# Patient Record
Sex: Female | Born: 1985 | Race: Black or African American | Hispanic: No | Marital: Single | Smoking: Former smoker
Health system: Southern US, Community
[De-identification: ages and names within clinical notes are randomized; demographics above are authoritative.]

## PROBLEM LIST (undated history)

## (undated) ENCOUNTER — Inpatient Hospital Stay (HOSPITAL_COMMUNITY): Payer: Self-pay

## (undated) DIAGNOSIS — A749 Chlamydial infection, unspecified: Secondary | ICD-10-CM

## (undated) DIAGNOSIS — B999 Unspecified infectious disease: Secondary | ICD-10-CM

## (undated) DIAGNOSIS — R519 Headache, unspecified: Secondary | ICD-10-CM

## (undated) DIAGNOSIS — L409 Psoriasis, unspecified: Secondary | ICD-10-CM

## (undated) DIAGNOSIS — R51 Headache: Secondary | ICD-10-CM

## (undated) DIAGNOSIS — N611 Abscess of the breast and nipple: Secondary | ICD-10-CM

## (undated) HISTORY — PX: BREAST SURGERY: SHX581

## (undated) HISTORY — DX: Psoriasis, unspecified: L40.9

---

## 2010-04-06 ENCOUNTER — Emergency Department (HOSPITAL_COMMUNITY): Admission: EM | Admit: 2010-04-06 | Discharge: 2010-04-06 | Payer: Self-pay | Admitting: Family Medicine

## 2010-04-19 ENCOUNTER — Emergency Department (HOSPITAL_COMMUNITY): Admission: EM | Admit: 2010-04-19 | Discharge: 2010-04-19 | Payer: Self-pay | Admitting: Family Medicine

## 2010-11-05 LAB — WET PREP, GENITAL
Trich, Wet Prep: NONE SEEN
Yeast Wet Prep HPF POC: NONE SEEN

## 2010-11-05 LAB — POCT PREGNANCY, URINE: Preg Test, Ur: NEGATIVE

## 2011-01-25 ENCOUNTER — Emergency Department (HOSPITAL_COMMUNITY)
Admission: EM | Admit: 2011-01-25 | Discharge: 2011-01-26 | Disposition: A | Discharge status: Home / Self Care | Payer: BC Managed Care – PPO | Attending: Emergency Medicine | Admitting: Emergency Medicine

## 2011-01-25 DIAGNOSIS — R197 Diarrhea, unspecified: Secondary | ICD-10-CM | POA: Insufficient documentation

## 2011-01-25 DIAGNOSIS — N949 Unspecified condition associated with female genital organs and menstrual cycle: Secondary | ICD-10-CM | POA: Insufficient documentation

## 2011-01-25 DIAGNOSIS — R112 Nausea with vomiting, unspecified: Secondary | ICD-10-CM | POA: Insufficient documentation

## 2011-01-25 DIAGNOSIS — N938 Other specified abnormal uterine and vaginal bleeding: Secondary | ICD-10-CM | POA: Insufficient documentation

## 2011-01-25 LAB — POCT I-STAT, CHEM 8
Calcium, Ion: 1.07 mmol/L — ABNORMAL LOW (ref 1.12–1.32)
Creatinine, Ser: 1.1 mg/dL (ref 0.4–1.2)
Glucose, Bld: 98 mg/dL (ref 70–99)
Hemoglobin: 13.3 g/dL (ref 12.0–15.0)
Potassium: 4.1 mEq/L (ref 3.5–5.1)
TCO2: 23 mmol/L (ref 0–100)

## 2011-01-25 LAB — URINALYSIS, ROUTINE W REFLEX MICROSCOPIC
Glucose, UA: NEGATIVE mg/dL
Hgb urine dipstick: NEGATIVE
Leukocytes, UA: NEGATIVE
Specific Gravity, Urine: 1.023 (ref 1.005–1.030)
pH: 7 (ref 5.0–8.0)

## 2011-01-25 LAB — WET PREP, GENITAL
Trich, Wet Prep: NONE SEEN
Yeast Wet Prep HPF POC: NONE SEEN

## 2011-01-25 LAB — CBC
Hemoglobin: 12.2 g/dL (ref 12.0–15.0)
MCH: 28 pg (ref 26.0–34.0)
MCHC: 32.8 g/dL (ref 30.0–36.0)
RDW: 12.8 % (ref 11.5–15.5)

## 2011-01-25 LAB — URINE MICROSCOPIC-ADD ON

## 2011-01-25 LAB — HCG, QUANTITATIVE, PREGNANCY: hCG, Beta Chain, Quant, S: 1 m[IU]/mL (ref <5)

## 2011-08-03 ENCOUNTER — Other Ambulatory Visit: Payer: Self-pay | Admitting: Family Medicine

## 2011-08-03 DIAGNOSIS — E010 Iodine-deficiency related diffuse (endemic) goiter: Secondary | ICD-10-CM

## 2011-08-05 ENCOUNTER — Other Ambulatory Visit: Payer: BC Managed Care – PPO

## 2011-08-08 ENCOUNTER — Ambulatory Visit
Admission: RE | Admit: 2011-08-08 | Discharge: 2011-08-08 | Disposition: A | Discharge status: Home / Self Care | Payer: BC Managed Care – PPO | Source: Ambulatory Visit | Attending: Family Medicine | Admitting: Family Medicine

## 2011-08-08 DIAGNOSIS — E010 Iodine-deficiency related diffuse (endemic) goiter: Secondary | ICD-10-CM

## 2011-12-14 ENCOUNTER — Other Ambulatory Visit: Payer: Self-pay | Admitting: Family Medicine

## 2011-12-14 ENCOUNTER — Ambulatory Visit
Admission: RE | Admit: 2011-12-14 | Discharge: 2011-12-14 | Disposition: A | Discharge status: Home / Self Care | Payer: No Typology Code available for payment source | Source: Ambulatory Visit | Attending: Family Medicine | Admitting: Family Medicine

## 2011-12-14 DIAGNOSIS — Z006 Encounter for examination for normal comparison and control in clinical research program: Secondary | ICD-10-CM

## 2013-02-13 ENCOUNTER — Encounter (HOSPITAL_COMMUNITY): Payer: Self-pay | Admitting: *Deleted

## 2013-02-13 ENCOUNTER — Other Ambulatory Visit: Payer: Self-pay

## 2013-02-13 ENCOUNTER — Emergency Department (HOSPITAL_COMMUNITY): Payer: BC Managed Care – PPO

## 2013-02-13 ENCOUNTER — Emergency Department (HOSPITAL_COMMUNITY)
Admission: EM | Admit: 2013-02-13 | Discharge: 2013-02-14 | Disposition: A | Discharge status: Home / Self Care | Payer: BC Managed Care – PPO | Attending: Emergency Medicine | Admitting: Emergency Medicine

## 2013-02-13 DIAGNOSIS — K209 Esophagitis, unspecified without bleeding: Secondary | ICD-10-CM

## 2013-02-13 DIAGNOSIS — R110 Nausea: Secondary | ICD-10-CM | POA: Insufficient documentation

## 2013-02-13 DIAGNOSIS — R0602 Shortness of breath: Secondary | ICD-10-CM | POA: Insufficient documentation

## 2013-02-13 DIAGNOSIS — R197 Diarrhea, unspecified: Secondary | ICD-10-CM | POA: Insufficient documentation

## 2013-02-13 LAB — CBC
MCH: 28.4 pg (ref 26.0–34.0)
MCHC: 33.2 g/dL (ref 30.0–36.0)
MCV: 85.4 fL (ref 78.0–100.0)
Platelets: 252 10*3/uL (ref 150–400)
RBC: 3.91 MIL/uL (ref 3.87–5.11)

## 2013-02-13 NOTE — ED Notes (Signed)
Pt states she started having chest pain yesterday. Pt states she has been lying around in the bed since Monday. Pt states this is new pt states she has been lying in bed since Monday d/t she thinks she may have stomach virus.

## 2013-02-14 LAB — BASIC METABOLIC PANEL
CO2: 26 mEq/L (ref 19–32)
Calcium: 9 mg/dL (ref 8.4–10.5)
Creatinine, Ser: 0.93 mg/dL (ref 0.50–1.10)
Glucose, Bld: 98 mg/dL (ref 70–99)

## 2013-02-14 LAB — POCT I-STAT TROPONIN I: Troponin i, poc: 0 ng/mL (ref 0.00–0.08)

## 2013-02-14 MED ORDER — SODIUM CHLORIDE 0.9 % IV SOLN
Freq: Once | INTRAVENOUS | Status: AC
  Administered 2013-02-14: 01:00:00 via INTRAVENOUS

## 2013-02-14 MED ORDER — SUCRALFATE 1 G PO TABS
1.0000 g | ORAL_TABLET | Freq: Once | ORAL | Status: AC
  Administered 2013-02-14: 1 g via ORAL
  Filled 2013-02-14: qty 1

## 2013-02-14 MED ORDER — MORPHINE SULFATE 4 MG/ML IJ SOLN
4.0000 mg | Freq: Once | INTRAMUSCULAR | Status: AC
  Administered 2013-02-14: 4 mg via INTRAVENOUS
  Filled 2013-02-14: qty 1

## 2013-02-14 MED ORDER — GI COCKTAIL ~~LOC~~
30.0000 mL | Freq: Once | ORAL | Status: AC
  Administered 2013-02-14: 30 mL via ORAL
  Filled 2013-02-14: qty 30

## 2013-02-14 MED ORDER — KETOROLAC TROMETHAMINE 30 MG/ML IJ SOLN
30.0000 mg | Freq: Once | INTRAMUSCULAR | Status: AC
  Administered 2013-02-14: 30 mg via INTRAVENOUS
  Filled 2013-02-14: qty 1

## 2013-02-14 MED ORDER — SUCRALFATE 1 G PO TABS: 1.0000 g | ORAL_TABLET | Freq: Four times a day (QID) | ORAL | Status: DC

## 2013-02-14 MED ORDER — OMEPRAZOLE 20 MG PO CPDR: 20.0000 mg | DELAYED_RELEASE_CAPSULE | Freq: Every day | ORAL | Status: DC

## 2013-02-14 MED ORDER — HYDROCODONE-ACETAMINOPHEN 5-325 MG PO TABS: 1.0000 | ORAL_TABLET | ORAL | Status: DC | PRN

## 2013-02-14 MED ORDER — POTASSIUM CHLORIDE CRYS ER 20 MEQ PO TBCR
40.0000 meq | EXTENDED_RELEASE_TABLET | Freq: Once | ORAL | Status: AC
  Administered 2013-02-14: 40 meq via ORAL
  Filled 2013-02-14: qty 2

## 2013-02-14 MED ORDER — ONDANSETRON HCL 8 MG PO TABS: 8.0000 mg | ORAL_TABLET | Freq: Three times a day (TID) | ORAL | Status: DC | PRN

## 2013-02-14 MED ORDER — FAMOTIDINE IN NACL 20-0.9 MG/50ML-% IV SOLN
20.0000 mg | Freq: Once | INTRAVENOUS | Status: AC
  Administered 2013-02-14: 20 mg via INTRAVENOUS
  Filled 2013-02-14: qty 50

## 2013-02-14 MED ORDER — PANTOPRAZOLE SODIUM 40 MG IV SOLR
40.0000 mg | Freq: Once | INTRAVENOUS | Status: AC
  Administered 2013-02-14: 40 mg via INTRAVENOUS
  Filled 2013-02-14: qty 40

## 2013-02-14 NOTE — ED Provider Notes (Signed)
History    CSN: [id number] Arrival date & time 02/13/13  2303  First MD Initiated Contact with Patient 02/13/13 2350     Chief Complaint  Patient presents with  . Chest Pain   (Consider location/radiation/quality/duration/timing/severity/associated sxs/prior Treatment) HPI History provided by pt.   Pt has had severe pain mid-line chest w/ radiation through to the back since yesterday at 11am.  Constant and aggravated by laying flat and deep inspration.  No relief w/ mylanta.  Associated w/ nausea and diarrhea as well as SOB.  Denies fever, cough, hematochezia/melena, GU sx and LE edema/pain.  No PMH and does not smoke cigarettes.  No RF for PE.  Does not drink alcohol.  Ate spaghetti the night before onset sx. History reviewed. No pertinent past medical history. History reviewed. No pertinent past surgical history. History reviewed. No pertinent family history. History  Substance Use Topics  . Smoking status: Never Smoker   . Smokeless tobacco: Never Used  . Alcohol Use: No   OB History   Grav Para Term Preterm Abortions TAB SAB Ect Mult Living                 Review of Systems  All other systems reviewed and are negative.    Allergies  Review of patient's allergies indicates no known allergies.  Home Medications   Current Outpatient Rx  Name  Route  Sig  Dispense  Refill  . calcium & magnesium carbonates (MYLANTA) 311-232 MG per tablet   Oral   Take 2 tablets by mouth 2 (two) times daily as needed for heartburn.          BP 125/81  Pulse 84  Temp(Src) 98.2 F (36.8 C) (Oral)  Resp 18  SpO2 100%  LMP 02/06/2013 Physical Exam  Nursing note and vitals reviewed. Constitutional: She is oriented to person, place, and time. She appears well-developed and well-nourished. No distress.  HENT:  Head: Normocephalic and atraumatic.  Eyes:  Normal appearance  Neck: Normal range of motion.  Cardiovascular: Normal rate and regular rhythm.   Pulmonary/Chest: Effort  normal and breath sounds normal. No respiratory distress.  Mild tenderness over sternum and LSB  Abdominal: Soft. Bowel sounds are normal. She exhibits no distension and no mass. There is no rebound and no guarding.  Obese. Epigastric ttp  Genitourinary:  No CVA tenderness  Musculoskeletal: Normal range of motion.  Neurological: She is alert and oriented to person, place, and time.  Skin: Skin is warm and dry. No rash noted.  Psychiatric: She has a normal mood and affect. Her behavior is normal.    ED Course  Procedures (including critical care time)  Date: 02/14/2013  Rate: 79  Rhythm: normal sinus rhythm  QRS Axis: normal  Intervals: normal  ST/T Wave abnormalities: normal  Conduction Disutrbances:none  Narrative Interpretation:   Old EKG Reviewed: none available   Labs Reviewed  CBC - Abnormal; Notable for the following:    Hemoglobin 11.1 (*)    HCT 33.4 (*)    All other components within normal limits  BASIC METABOLIC PANEL - Abnormal; Notable for the following:    Potassium 3.2 (*)    GFR calc non Af Amer 84 (*)    All other components within normal limits  POCT I-STAT TROPONIN I   Dg Chest Portable 1 View  02/13/2013   *RADIOLOGY REPORT*  Clinical Data: Chest pain, shortness of breath.  PORTABLE CHEST - 1 VIEW  Comparison: 12/14/2011  Findings: Heart and mediastinal  contours are within normal limits. No focal opacities or effusions.  No acute bony abnormality.  IMPRESSION: No active cardiopulmonary disease.   Original Report Authenticated By: Charlett Nose, M.D.   1. Esophagitis     MDM  27yo healthy F presents w/ CP.  No RF for or exam findings concerning for PE.  Doubt ACS based on atypical nature of pain, lack of RF and non-ischemic EKG.  CXR unremarkable.  Labs sig for mild hypokalemia only. Suspect gastritis/esophagitis; pain aggravated by laying flat, associated w/ nausea and diarrhea, ate spaghetti night before onset, and epigastrium ttp.  GI cocktail and IV  protonix/pepcid ordered.  Will reassess shortly.  12:40 AM   No improvement w/ pain.  Exam unchanged.  Pt refuses to lay back or lean forward d/t aggravation of pain.  Still have high suspicion for esophagitis.  IV morphine and po carafate ordered. 2:01 AM   No improvement in pain.  IV toradol ordered. 2:46 AM   Pain improved shortly after receiving toradol.  Etiology of pain unclear.  Will recommended low dose ibuprofen for possible costochondritis but prilosec and carafate as well.  Also prescribed zofran and hydrocodone.  Recommended avoidance of aggravating activities as well as alcohol, caffeine and acidic/spicy foods.  Return precautions discussed. 3:32 AM   Otilio Miu, PA-C 02/14/13 [phone]

## 2013-02-14 NOTE — ED Provider Notes (Signed)
Medical screening examination/treatment/procedure(s) were performed by non-physician practitioner and as supervising physician I was immediately available for consultation/collaboration.  Sunnie Nielsen, MD 02/14/13 [phone]

## 2013-10-15 ENCOUNTER — Ambulatory Visit: Payer: Self-pay | Admitting: Obstetrics

## 2013-10-28 ENCOUNTER — Ambulatory Visit (INDEPENDENT_AMBULATORY_CARE_PROVIDER_SITE_OTHER): Payer: BC Managed Care – PPO | Admitting: Obstetrics

## 2013-10-28 ENCOUNTER — Encounter: Payer: Self-pay | Admitting: Obstetrics

## 2013-10-28 VITALS — BP 114/72 | HR 81 | Temp 98.8°F | Ht 62.0 in | Wt 199.0 lb

## 2013-10-28 DIAGNOSIS — E669 Obesity, unspecified: Secondary | ICD-10-CM | POA: Insufficient documentation

## 2013-10-28 DIAGNOSIS — L309 Dermatitis, unspecified: Secondary | ICD-10-CM | POA: Insufficient documentation

## 2013-10-28 DIAGNOSIS — Z124 Encounter for screening for malignant neoplasm of cervix: Secondary | ICD-10-CM

## 2013-10-28 DIAGNOSIS — Z01419 Encounter for gynecological examination (general) (routine) without abnormal findings: Secondary | ICD-10-CM

## 2013-10-28 DIAGNOSIS — Z113 Encounter for screening for infections with a predominantly sexual mode of transmission: Secondary | ICD-10-CM

## 2013-10-28 MED ORDER — PHENTERMINE HCL 37.5 MG PO CAPS: 37.5000 mg | ORAL_CAPSULE | ORAL | Status: DC

## 2013-10-28 NOTE — Progress Notes (Signed)
Subjective:     Amanda Peters is a 28 y.o. female here for a routine exam.  Current complaints: Patient is in the office today for an Annual Exam. Patient states she has been breaking out all over. Patient states she would like to discuss her weight. Personal health questionnaire reviewed: yes.   Gynecologic History Patient's last menstrual period was 10/09/2013. Contraception: none Last Pap: 05/07/2012. Results were: normal  Obstetric History OB History  Gravida Para Term Preterm AB SAB TAB Ectopic Multiple Living  0 0 0 0 0 0 0 0 0 0          The following portions of the patient's history were reviewed and updated as appropriate: allergies, current medications, past family history, past medical history, past social history, past surgical history and problem list.  Review of Systems Pertinent items are noted in HPI.    Objective:    General appearance: alert and no distress Breasts: normal appearance, no masses or tenderness Abdomen: normal findings: soft, non-tender Pelvic: cervix normal in appearance, external genitalia normal, no adnexal masses or tenderness, no cervical motion tenderness, rectovaginal septum normal, uterus normal size, shape, and consistency and vagina normal without discharge Skin: Pruritic, hyperpigmented spots with a generalized distribution.    Assessment:    Healthy female exam.   Dermatitis   Plan:    Education reviewed: Management of skin problems.. Contraception: none. Follow up in: 1 year. Referred to Dermatologist

## 2013-10-29 ENCOUNTER — Other Ambulatory Visit: Payer: Self-pay | Admitting: Dermatology

## 2013-10-29 LAB — GC/CHLAMYDIA PROBE AMP
CT Probe RNA: NEGATIVE
GC Probe RNA: NEGATIVE

## 2013-10-29 LAB — PAP IG W/ RFLX HPV ASCU

## 2013-10-29 LAB — WET PREP BY MOLECULAR PROBE
Candida species: NEGATIVE
GARDNERELLA VAGINALIS: NEGATIVE
Trichomonas vaginosis: NEGATIVE

## 2013-11-13 ENCOUNTER — Encounter: Payer: Self-pay | Admitting: Obstetrics

## 2014-01-09 ENCOUNTER — Telehealth: Payer: Self-pay | Admitting: *Deleted

## 2014-01-09 DIAGNOSIS — B373 Candidiasis of vulva and vagina: Secondary | ICD-10-CM

## 2014-01-09 DIAGNOSIS — B3731 Acute candidiasis of vulva and vagina: Secondary | ICD-10-CM

## 2014-01-09 MED ORDER — FLUCONAZOLE 150 MG PO TABS: 150.0000 mg | ORAL_TABLET | Freq: Once | ORAL | Status: DC

## 2014-01-09 NOTE — Telephone Encounter (Signed)
Requesting refill on Diflucan.

## 2014-01-09 NOTE — Telephone Encounter (Signed)
Diflucan e.scribed .

## 2014-01-15 ENCOUNTER — Other Ambulatory Visit: Payer: Self-pay | Admitting: Obstetrics

## 2014-01-15 ENCOUNTER — Encounter: Payer: Self-pay | Admitting: Obstetrics

## 2014-01-15 ENCOUNTER — Ambulatory Visit (INDEPENDENT_AMBULATORY_CARE_PROVIDER_SITE_OTHER): Payer: BC Managed Care – PPO | Admitting: Obstetrics

## 2014-01-15 VITALS — BP 116/74 | HR 88 | Temp 99.2°F | Ht 62.0 in | Wt 186.0 lb

## 2014-01-15 DIAGNOSIS — B373 Candidiasis of vulva and vagina: Secondary | ICD-10-CM

## 2014-01-15 DIAGNOSIS — Z113 Encounter for screening for infections with a predominantly sexual mode of transmission: Secondary | ICD-10-CM

## 2014-01-15 DIAGNOSIS — B3731 Acute candidiasis of vulva and vagina: Secondary | ICD-10-CM

## 2014-01-15 MED ORDER — FLUCONAZOLE 150 MG PO TABS: ORAL_TABLET | ORAL | Status: DC

## 2014-01-15 NOTE — Addendum Note (Signed)
Addended by: Odessa Fleming on: 01/15/2014 05:18 PM   Modules accepted: Orders

## 2014-01-15 NOTE — Progress Notes (Signed)
Patient ID: Amanda Peters, female   DOB: [birth year], 29 y.o.   MRN: [id number]  Chief Complaint  Patient presents with  . Gynecologic Exam    HPI Amanda Peters is a 28 y.o. female.  Vaginal itching and curdy white discharge.  HPI  Past Medical History  Diagnosis Date  . Psoriasis     No past surgical history on file.  Family History  Problem Relation Age of Onset  . Hypertension Father   . Hypothyroidism Mother     Social History History  Substance Use Topics  . Smoking status: Former Games developer  . Smokeless tobacco: Never Used  . Alcohol Use: No    No Known Allergies  Current Outpatient Prescriptions  Medication Sig Dispense Refill  . fluconazole (DIFLUCAN) 150 MG tablet Take 1 tablet every other day.  3 tablet  2  . phentermine 37.5 MG capsule Take 1 capsule (37.5 mg total) by mouth every morning.  30 capsule  2   No current facility-administered medications for this visit.    Review of Systems Review of Systems Constitutional: negative for fatigue and weight loss Respiratory: negative for cough and wheezing Cardiovascular: negative for chest pain, fatigue and palpitations Gastrointestinal: negative for abdominal pain and change in bowel habits Genitourinary:  Vaginal discharge and itching. Integument/breast: negative for nipple discharge Musculoskeletal:negative for myalgias Neurological: negative for gait problems and tremors Behavioral/Psych: negative for abusive relationship, depression Endocrine: negative for temperature intolerance     Blood pressure 116/74, pulse 88, temperature 99.2 F (37.3 C), height 5\' 2"  (1.575 m), weight 186 lb (84.369 kg), last menstrual period 12/24/2013.  Physical Exam Physical Exam General:   alert  Skin:   no rash or abnormalities  Lungs:   clear to auscultation bilaterally  Heart:   regular rate and rhythm, S1, S2 normal, no murmur, click, rub or gallop  Breasts:   normal without suspicious masses, skin or nipple  changes or axillary nodes  Abdomen:  normal findings: no organomegaly, soft, non-tender and no hernia  Pelvis:  External genitalia: normal general appearance Urinary system: urethral meatus normal and bladder without fullness, nontender Vaginal:  Moderate amount of curdy white discharge. Cervix: normal appearance Adnexa: normal bimanual exam Uterus: anteverted and non-tender, normal size      Data Reviewed labs  Assessment    Candida vulvovaginitis.     Plan    Diflucan Rx. F/u prn  No orders of the defined types were placed in this encounter.   Meds ordered this encounter  Medications  . fluconazole (DIFLUCAN) 150 MG tablet    Sig: Take 1 tablet every other day.    Dispense:  3 tablet    Refill:  2       Brock Bad 01/15/2014, 3:16 PM

## 2014-01-16 LAB — WET PREP BY MOLECULAR PROBE
Candida species: POSITIVE — AB
GARDNERELLA VAGINALIS: POSITIVE — AB
Trichomonas vaginosis: NEGATIVE

## 2014-01-17 LAB — GC/CHLAMYDIA PROBE AMP
CT PROBE, AMP APTIMA: NEGATIVE
GC Probe RNA: NEGATIVE

## 2014-01-22 ENCOUNTER — Encounter: Payer: Self-pay | Admitting: *Deleted

## 2014-01-24 ENCOUNTER — Other Ambulatory Visit: Payer: Self-pay | Admitting: *Deleted

## 2014-01-24 ENCOUNTER — Encounter: Payer: Self-pay | Admitting: *Deleted

## 2014-01-24 DIAGNOSIS — B9689 Other specified bacterial agents as the cause of diseases classified elsewhere: Secondary | ICD-10-CM

## 2014-01-24 DIAGNOSIS — N760 Acute vaginitis: Principal | ICD-10-CM

## 2014-01-24 MED ORDER — METRONIDAZOLE 500 MG PO TABS
500.0000 mg | ORAL_TABLET | Freq: Two times a day (BID) | ORAL | Status: DC
Start: 2014-01-24 — End: 2014-11-04

## 2014-04-29 ENCOUNTER — Other Ambulatory Visit: Payer: Self-pay | Admitting: Obstetrics

## 2014-10-29 ENCOUNTER — Ambulatory Visit: Payer: BLUE CROSS/BLUE SHIELD | Admitting: Obstetrics

## 2014-11-04 ENCOUNTER — Encounter: Payer: Self-pay | Admitting: Obstetrics

## 2014-11-04 ENCOUNTER — Ambulatory Visit (INDEPENDENT_AMBULATORY_CARE_PROVIDER_SITE_OTHER): Payer: BLUE CROSS/BLUE SHIELD | Admitting: Obstetrics

## 2014-11-04 VITALS — BP 113/73 | HR 87 | Temp 99.0°F | Ht 62.25 in | Wt 194.0 lb

## 2014-11-04 DIAGNOSIS — E669 Obesity, unspecified: Secondary | ICD-10-CM

## 2014-11-04 DIAGNOSIS — Z124 Encounter for screening for malignant neoplasm of cervix: Secondary | ICD-10-CM | POA: Diagnosis not present

## 2014-11-04 DIAGNOSIS — Z01419 Encounter for gynecological examination (general) (routine) without abnormal findings: Secondary | ICD-10-CM

## 2014-11-04 DIAGNOSIS — Z30011 Encounter for initial prescription of contraceptive pills: Secondary | ICD-10-CM | POA: Diagnosis not present

## 2014-11-04 DIAGNOSIS — N946 Dysmenorrhea, unspecified: Secondary | ICD-10-CM | POA: Diagnosis not present

## 2014-11-04 MED ORDER — PHENTERMINE HCL 37.5 MG PO CAPS: 37.5000 mg | ORAL_CAPSULE | ORAL | Status: DC

## 2014-11-04 MED ORDER — IBUPROFEN 800 MG PO TABS: 800.0000 mg | ORAL_TABLET | Freq: Three times a day (TID) | ORAL | Status: DC | PRN

## 2014-11-04 MED ORDER — NORGESTIM-ETH ESTRAD TRIPHASIC 0.18/0.215/0.25 MG-35 MCG PO TABS: 1.0000 | ORAL_TABLET | Freq: Every day | ORAL | Status: DC

## 2014-11-04 NOTE — Progress Notes (Signed)
Subjective:        Amanda Peters is a 29 y.o. female here for a routine exam.  Current complaints: none.    Personal health questionnaire:  Is patient [name], have a family history of breast and/or ovarian cancer: no Is there a family history of uterine cancer diagnosed at age < 2250, gastrointestinal cancer, urinary tract cancer, family member who is a Personnel officerLynch syndrome-associated carrier: no Is the patient overweight and hypertensive, family history of diabetes, personal history of gestational diabetes, preeclampsia or PCOS: no Is patient over 4155, have PCOS,  family history of premature CHD under age 28, diabetes, smoke, have hypertension or peripheral artery disease:  no At any time, has a partner hit, kicked or otherwise hurt or frightened you?: no Over the past 2 weeks, have you felt down, depressed or hopeless?: no Over the past 2 weeks, have you felt little interest or pleasure in doing things?:no   Gynecologic History Patient's last menstrual period was 10/20/2014. Contraception: condoms Last Pap: 10-28-13. Results were: normal Last mammogram: n/a. Results were: n/a  Obstetric History OB History  Gravida Para Term Preterm AB SAB TAB Ectopic Multiple Living  0 0 0 0 0 0 0 0 0 0         Past Medical History  Diagnosis Date  . Psoriasis     History reviewed. No pertinent past surgical history.  No current outpatient prescriptions on file. No Known Allergies  History  Substance Use Topics  . Smoking status: Former Games developermoker  . Smokeless tobacco: Never Used  . Alcohol Use: No    Family History  Problem Relation Age of Onset  . Hypertension Father   . Hypothyroidism Mother       Review of Systems  Constitutional: negative for fatigue and weight loss Respiratory: negative for cough and wheezing Cardiovascular: negative for chest pain, fatigue and palpitations Gastrointestinal: negative for abdominal pain and change in bowel  habits Musculoskeletal:negative for myalgias Neurological: negative for gait problems and tremors Behavioral/Psych: negative for abusive relationship, depression Endocrine: negative for temperature intolerance   Genitourinary:negative for abnormal menstrual periods, genital lesions, hot flashes, sexual problems and vaginal discharge Integument/breast: negative for breast lump, breast tenderness, nipple discharge and skin lesion(s)    Objective:       BP 113/73 mmHg  Pulse 87  Temp(Src) 99 F (37.2 C)  Ht 5' 2.25" (1.581 m)  Wt 194 lb (87.998 kg)  BMI 35.21 kg/m2  LMP 10/20/2014 General:   alert  Skin:   no rash or abnormalities  Lungs:   clear to auscultation bilaterally  Heart:   regular rate and rhythm, S1, S2 normal, no murmur, click, rub or gallop  Breasts:   normal without suspicious masses, skin or nipple changes or axillary nodes  Abdomen:  normal findings: no organomegaly, soft, non-tender and no hernia  Pelvis:  External genitalia: normal general appearance Urinary system: urethral meatus normal and bladder without fullness, nontender Vaginal: normal without tenderness, induration or masses Cervix: normal appearance Adnexa: normal bimanual exam Uterus: anteverted and non-tender, normal size   Lab Review Urine pregnancy test Labs reviewed yes Radiologic studies reviewed no    Assessment:    Healthy female exam.    Contraception Obesity   Plan:    Education reviewed: calcium supplements, low fat, low cholesterol diet, safe sex/STD prevention, self breast exams and weight bearing exercise. Contraception: OCP (estrogen/progesterone). Follow up in: 4 months.  F/U weight management and OCP surveillance.   No orders of the  defined types were placed in this encounter.   No orders of the defined types were placed in this encounter.

## 2014-11-05 LAB — PAP IG W/ RFLX HPV ASCU

## 2014-11-06 ENCOUNTER — Encounter: Payer: Self-pay | Admitting: Obstetrics

## 2014-11-06 LAB — SURESWAB, VAGINOSIS/VAGINITIS PLUS
ATOPOBIUM VAGINAE: NOT DETECTED Log (cells/mL)
C. GLABRATA, DNA: NOT DETECTED
C. PARAPSILOSIS, DNA: NOT DETECTED
C. TROPICALIS, DNA: NOT DETECTED
C. albicans, DNA: NOT DETECTED
C. trachomatis RNA, TMA: NOT DETECTED
GARDNERELLA VAGINALIS: NOT DETECTED Log (cells/mL)
MEGASPHAERA SPECIES: NOT DETECTED Log (cells/mL)
N. gonorrhoeae RNA, TMA: NOT DETECTED
T. VAGINALIS RNA, QL TMA: NOT DETECTED

## 2014-12-31 ENCOUNTER — Telehealth: Payer: Self-pay | Admitting: *Deleted

## 2014-12-31 DIAGNOSIS — B3731 Acute candidiasis of vulva and vagina: Secondary | ICD-10-CM

## 2014-12-31 DIAGNOSIS — B373 Candidiasis of vulva and vagina: Secondary | ICD-10-CM

## 2014-12-31 MED ORDER — FLUCONAZOLE 150 MG PO TABS: 150.0000 mg | ORAL_TABLET | Freq: Every day | ORAL | Status: DC

## 2014-12-31 NOTE — Telephone Encounter (Signed)
Patient is requesting a Rx for Diflucan be forwarded to her pharmacy. 2:37 Patient c/o itching and discharge. Patient does have an appointment on 5/23 which she plans to keep. Rx sent to pharmacy.

## 2015-01-12 ENCOUNTER — Ambulatory Visit: Payer: BLUE CROSS/BLUE SHIELD | Admitting: Obstetrics

## 2015-01-14 ENCOUNTER — Encounter: Payer: Self-pay | Admitting: Obstetrics

## 2015-01-14 ENCOUNTER — Ambulatory Visit (INDEPENDENT_AMBULATORY_CARE_PROVIDER_SITE_OTHER): Payer: BLUE CROSS/BLUE SHIELD | Admitting: Certified Nurse Midwife

## 2015-01-14 VITALS — BP 119/77 | HR 91 | Temp 97.6°F | Ht 63.0 in | Wt 182.0 lb

## 2015-01-14 DIAGNOSIS — Z7251 High risk heterosexual behavior: Secondary | ICD-10-CM

## 2015-01-14 NOTE — Progress Notes (Signed)
Patient ID: Amanda Peters, female   DOB: [birth year], 29 y.o.   MRN: [id number]   Chief Complaint  Patient presents with  . Vaginitis    HPI Amanda Peters is a 29 y.o. female.  C/O change in vaginal discharge for over a week.  Has taken Diflucan as previously prescribed with improvement in symptoms, then started her period.  Has had a questionable exposure to STI and desires screening, does not want blood testing.  States that her vaginal discharge does not have an odor or itching and is thick&white with copious amounts.   HPI  Past Medical History  Diagnosis Date  . Psoriasis     History reviewed. No pertinent past surgical history.  Family History  Problem Relation Age of Onset  . Hypertension Father   . Hypothyroidism Mother     Social History History  Substance Use Topics  . Smoking status: Former Games developermoker  . Smokeless tobacco: Never Used  . Alcohol Use: 0.0 oz/week    0 Standard drinks or equivalent per week     Comment: Ocassionally    No Known Allergies  Current Outpatient Prescriptions  Medication Sig Dispense Refill  . ibuprofen (ADVIL,MOTRIN) 800 MG tablet Take 1 tablet (800 mg total) by mouth every 8 (eight) hours as needed for mild pain or moderate pain. 30 tablet 8  . Norgestimate-Ethinyl Estradiol Triphasic (ORTHO TRI-CYCLEN, 28,) 0.18/0.215/0.25 MG-35 MCG tablet Take 1 tablet by mouth daily. 1 Package 11  . phentermine 37.5 MG capsule Take 1 capsule (37.5 mg total) by mouth every morning. 30 capsule 2   No current facility-administered medications for this visit.    Review of Systems Review of Systems Constitutional: negative for fatigue and weight loss Respiratory: negative for cough and wheezing Cardiovascular: negative for chest pain, fatigue and palpitations Gastrointestinal: negative for abdominal pain and change in bowel habits Genitourinary: +vaginal discharge Integument/breast: negative for nipple discharge Musculoskeletal:negative for  myalgias Neurological: negative for gait problems and tremors Behavioral/Psych: negative for abusive relationship, depression Endocrine: negative for temperature intolerance     Blood pressure 119/77, pulse 91, temperature 97.6 F (36.4 C), height 5\' 3"  (1.6 m), weight 182 lb (82.555 kg), last menstrual period 01/08/2015.  Physical Exam Physical Exam General:   alert  Skin:   no rash or abnormalities  Lungs:   clear to auscultation bilaterally  Heart:   regular rate and rhythm, S1, S2 normal, no murmur, click, rub or gallop  Breasts:   deferred  Abdomen:  normal findings: no organomegaly, soft, non-tender and no hernia  Pelvis:  External genitalia: normal general appearance Urinary system: urethral meatus normal and bladder without fullness, nontender Vaginal: normal without tenderness, induration or masses Cervix: no CMT Adnexa: normal bimanual exam Uterus: anteverted and non-tender, normal size    50% of 15 min visit spent on counseling and coordination of care.   Data Reviewed Previous medical hx, labs, meds  Assessment     Normal vaginal flora High risk sexual activity     Plan    No orders of the defined types were placed in this encounter.   No orders of the defined types were placed in this encounter.    Follow up as needed or for annual exam in October.

## 2015-01-18 LAB — SURESWAB, VAGINOSIS/VAGINITIS PLUS
Atopobium vaginae: NOT DETECTED Log (cells/mL)
C. GLABRATA, DNA: NOT DETECTED
C. PARAPSILOSIS, DNA: NOT DETECTED
C. TRACHOMATIS RNA, TMA: NOT DETECTED
C. TROPICALIS, DNA: NOT DETECTED
C. albicans, DNA: NOT DETECTED
GARDNERELLA VAGINALIS: NOT DETECTED Log (cells/mL)
LACTOBACILLUS SPECIES: 7.8 Log (cells/mL)
MEGASPHAERA SPECIES: NOT DETECTED Log (cells/mL)
N. GONORRHOEAE RNA, TMA: NOT DETECTED
T. vaginalis RNA, QL TMA: NOT DETECTED

## 2015-03-09 ENCOUNTER — Ambulatory Visit: Payer: BLUE CROSS/BLUE SHIELD | Admitting: Obstetrics

## 2015-03-19 ENCOUNTER — Encounter: Payer: Self-pay | Admitting: Obstetrics

## 2015-03-19 ENCOUNTER — Ambulatory Visit (INDEPENDENT_AMBULATORY_CARE_PROVIDER_SITE_OTHER): Payer: BLUE CROSS/BLUE SHIELD | Admitting: Obstetrics

## 2015-03-19 VITALS — BP 117/75 | HR 98 | Temp 98.8°F | Ht 62.25 in | Wt 180.0 lb

## 2015-03-19 DIAGNOSIS — N760 Acute vaginitis: Secondary | ICD-10-CM | POA: Diagnosis not present

## 2015-03-19 DIAGNOSIS — A499 Bacterial infection, unspecified: Secondary | ICD-10-CM

## 2015-03-19 DIAGNOSIS — E669 Obesity, unspecified: Secondary | ICD-10-CM

## 2015-03-19 DIAGNOSIS — B9689 Other specified bacterial agents as the cause of diseases classified elsewhere: Secondary | ICD-10-CM

## 2015-03-19 MED ORDER — TINIDAZOLE 500 MG PO TABS: 1000.0000 mg | ORAL_TABLET | Freq: Every day | ORAL | Status: DC

## 2015-03-19 MED ORDER — FLUCONAZOLE 150 MG PO TABS: 150.0000 mg | ORAL_TABLET | Freq: Once | ORAL | Status: DC

## 2015-03-19 NOTE — Progress Notes (Signed)
Patient ID: Amanda Peters, female   DOB: March 14, 1986, 29 y.o.   MRN: [id number]  Chief Complaint  Patient presents with  . Follow-up    HPI Amanda Peters is a 29 y.o. female.  C/O malodorous vaginal discharge.  Also presents for F/U for weight loss management.  HPI  Past Medical History  Diagnosis Date  . Psoriasis     History reviewed. No pertinent past surgical history.  Family History  Problem Relation Age of Onset  . Hypertension Father   . Hypothyroidism Mother     Social History History  Substance Use Topics  . Smoking status: Former Games developer  . Smokeless tobacco: Never Used  . Alcohol Use: 0.0 oz/week    0 Standard drinks or equivalent per week     Comment: Ocassionally    No Known Allergies  Current Outpatient Prescriptions  Medication Sig Dispense Refill  . ibuprofen (ADVIL,MOTRIN) 800 MG tablet Take 1 tablet (800 mg total) by mouth every 8 (eight) hours as needed for mild pain or moderate pain. 30 tablet 8  . Norgestimate-Ethinyl Estradiol Triphasic (ORTHO TRI-CYCLEN, 28,) 0.18/0.215/0.25 MG-35 MCG tablet Take 1 tablet by mouth daily. 1 Package 11  . phentermine 37.5 MG capsule Take 1 capsule (37.5 mg total) by mouth every morning. 30 capsule 2  . fluconazole (DIFLUCAN) 150 MG tablet Take 1 tablet (150 mg total) by mouth once. 1 tablet 2  . tinidazole (TINDAMAX) 500 MG tablet Take 2 tablets (1,000 mg total) by mouth daily with breakfast. 10 tablet 2   No current facility-administered medications for this visit.    Review of Systems Review of Systems Constitutional: positive for weight loss Respiratory: negative for cough and wheezing Cardiovascular: negative for chest pain, fatigue and palpitations Gastrointestinal: negative for abdominal pain and change in bowel habits Genitourinary: malodorous vaginal discharge Integument/breast: negative for nipple discharge Musculoskeletal:negative for myalgias Neurological: negative for gait problems and  tremors Behavioral/Psych: negative for abusive relationship, depression Endocrine: negative for temperature intolerance     Blood pressure 117/75, pulse 98, temperature 98.8 F (37.1 C), height 5' 2.25" (1.581 m), weight 180 lb (81.647 kg).  Physical Exam Physical Exam General:   alert  Skin:   no rash or abnormalities  Lungs:   clear to auscultation bilaterally  Heart:   regular rate and rhythm, S1, S2 normal, no murmur, click, rub or gallop  Breasts:   normal without suspicious masses, skin or nipple changes or axillary nodes  Abdomen:  normal findings: no organomegaly, soft, non-tender and no hernia  Pelvis:  External genitalia: normal general appearance Urinary system: urethral meatus normal and bladder without fullness, nontender Vaginal: normal without tenderness, induration or masses Cervix: normal appearance Adnexa: normal bimanual exam Uterus: anteverted and non-tender, normal size    Data Reviewed Weight Labs  Assessment     BV Obesity.  Good response to medical management     Plan    Tindamax Rx  Encouraged behavior modification with sensible low caloric diet and exercise. F/U prn  Orders Placed This Encounter  Procedures  . SureSwab, Vaginosis/Vaginitis Plus   Meds ordered this encounter  Medications  . tinidazole (TINDAMAX) 500 MG tablet    Sig: Take 2 tablets (1,000 mg total) by mouth daily with breakfast.    Dispense:  10 tablet    Refill:  2  . fluconazole (DIFLUCAN) 150 MG tablet    Sig: Take 1 tablet (150 mg total) by mouth once.    Dispense:  1 tablet    Refill:  2             

## 2015-03-23 ENCOUNTER — Other Ambulatory Visit: Payer: Self-pay | Admitting: Obstetrics

## 2015-03-23 LAB — SURESWAB, VAGINOSIS/VAGINITIS PLUS
Atopobium vaginae: >8 Log (cells/mL)
C. PARAPSILOSIS, DNA: NOT DETECTED
C. albicans, DNA: NOT DETECTED
C. glabrata, DNA: NOT DETECTED
C. trachomatis RNA, TMA: NOT DETECTED
C. tropicalis, DNA: NOT DETECTED
Gardnerella vaginalis: >8 Log (cells/mL)
LACTOBACILLUS SPECIES: NOT DETECTED Log (cells/mL)
MEGASPHAERA SPECIES: >8 Log (cells/mL)
N. GONORRHOEAE RNA, TMA: NOT DETECTED
T. vaginalis RNA, QL TMA: NOT DETECTED

## 2015-10-06 ENCOUNTER — Ambulatory Visit (INDEPENDENT_AMBULATORY_CARE_PROVIDER_SITE_OTHER): Payer: BLUE CROSS/BLUE SHIELD | Admitting: Obstetrics

## 2015-10-06 ENCOUNTER — Encounter: Payer: Self-pay | Admitting: Obstetrics

## 2015-10-06 VITALS — BP 113/76 | HR 77 | Ht 62.0 in | Wt 201.0 lb

## 2015-10-06 DIAGNOSIS — Z3009 Encounter for other general counseling and advice on contraception: Secondary | ICD-10-CM | POA: Diagnosis not present

## 2015-10-06 DIAGNOSIS — E669 Obesity, unspecified: Secondary | ICD-10-CM

## 2015-10-06 DIAGNOSIS — N946 Dysmenorrhea, unspecified: Secondary | ICD-10-CM | POA: Diagnosis not present

## 2015-10-06 MED ORDER — IBUPROFEN 800 MG PO TABS: 800.0000 mg | ORAL_TABLET | Freq: Three times a day (TID) | ORAL | Status: DC | PRN

## 2015-10-06 MED ORDER — PHENTERMINE HCL 37.5 MG PO CAPS: 37.5000 mg | ORAL_CAPSULE | ORAL | Status: DC

## 2015-10-06 NOTE — Progress Notes (Signed)
Patient ID: Amanda Peters, female   DOB: 12-30-1985, 30 y.o.   MRN: [id number]  Chief Complaint  Patient presents with  . Advice Only    phentermine, would like to start.  had taken in 2016. discuss depo    HPI Amanda Peters is a 30 y.o. female.   Obese.  Requests medical management of obesity.  Wants to continue contraception but not sure which is better with obesity.  Periods are painful but Ibuprofen works well. HPI  Past Medical History  Diagnosis Date  . Psoriasis     History reviewed. No pertinent past surgical history.  Family History  Problem Relation Age of Onset  . Hypertension Father   . Hypothyroidism Mother     Social History Social History  Substance Use Topics  . Smoking status: Former Games developer  . Smokeless tobacco: Never Used  . Alcohol Use: 0.0 oz/week    0 Standard drinks or equivalent per week     Comment: Ocassionally    No Known Allergies  Current Outpatient Prescriptions  Medication Sig Dispense Refill  . ibuprofen (ADVIL,MOTRIN) 800 MG tablet Take 1 tablet (800 mg total) by mouth every 8 (eight) hours as needed for mild pain or moderate pain. 30 tablet 8  . Norgestimate-Ethinyl Estradiol Triphasic (ORTHO TRI-CYCLEN, 28,) 0.18/0.215/0.25 MG-35 MCG tablet Take 1 tablet by mouth daily. (Patient not taking: Reported on 10/06/2015) 1 Package 11  . phentermine 37.5 MG capsule Take 1 capsule (37.5 mg total) by mouth every morning. 30 capsule 2   No current facility-administered medications for this visit.    Review of Systems Review of Systems Constitutional: negative for fatigue and weight loss.  Positive for obesity Respiratory: negative for cough and wheezing Cardiovascular: negative for chest pain, fatigue and palpitations Gastrointestinal: negative for abdominal pain and change in bowel habits Genitourinary: positive for painful periods Integument/breast: negative for nipple discharge Musculoskeletal:negative for myalgias Neurological:  negative for gait problems and tremors Behavioral/Psych: negative for abusive relationship, depression Endocrine: negative for temperature intolerance     Blood pressure 113/76, pulse 77, height  (1.575 m), weight 201 lb (91.173 kg), last menstrual period 09/23/2015.  Physical Exam Physical Exam: Deferred  100% of 15 min visit spent on counseling and coordination of care.   Data Reviewed Labs  Assessment     Obesity Contraceptive counseling and advice Dysmenorrhea      Plan:  Phentermine Rx with counseling on weight management with diet, exercise and medication all being important parts of successful weight management. Continue OCP's.  Depo Provera not a good choice with obesity. Ibuprofen Rx with counseling on management of dysmenorrhea. F/U in March for annual exam and pap.  No orders of the defined types were placed in this encounter.   Meds ordered this encounter  Medications  . phentermine 37.5 MG capsule    Sig: Take 1 capsule (37.5 mg total) by mouth every morning.    Dispense:  30 capsule    Refill:  2  . ibuprofen (ADVIL,MOTRIN) 800 MG tablet    Sig: Take 1 tablet (800 mg total) by mouth every 8 (eight) hours as needed for mild pain or moderate pain.    Dispense:  30 tablet    Refill:  8

## 2015-11-05 ENCOUNTER — Encounter: Payer: Self-pay | Admitting: Obstetrics

## 2015-11-05 ENCOUNTER — Ambulatory Visit (INDEPENDENT_AMBULATORY_CARE_PROVIDER_SITE_OTHER): Payer: BLUE CROSS/BLUE SHIELD | Admitting: Obstetrics

## 2015-11-05 VITALS — BP 114/74 | HR 80 | Temp 98.4°F | Wt 194.0 lb

## 2015-11-05 DIAGNOSIS — E669 Obesity, unspecified: Secondary | ICD-10-CM

## 2015-11-05 DIAGNOSIS — Z01419 Encounter for gynecological examination (general) (routine) without abnormal findings: Secondary | ICD-10-CM

## 2015-11-05 DIAGNOSIS — Z3009 Encounter for other general counseling and advice on contraception: Secondary | ICD-10-CM

## 2015-11-05 DIAGNOSIS — Z0000 Encounter for general adult medical examination without abnormal findings: Secondary | ICD-10-CM

## 2015-11-05 NOTE — Progress Notes (Signed)
Subjective:        Amanda Peters is a 30 y.o. female here for a routine exam.  Current complaints: Irregular periods.  On OCP's but taking properly.  Wants a different contraceptive because she cannot remember.   Personal health questionnaire:  Is patient Amanda Peters, have a family history of breast and/or ovarian cancer: no Is there a family history of uterine cancer diagnosed at age < 12, gastrointestinal cancer, urinary tract cancer, family member who is a Personnel officer syndrome-associated carrier: no Is the patient overweight and hypertensive, family history of diabetes, personal history of gestational diabetes, preeclampsia or PCOS: no Is patient over 53, have PCOS,  family history of premature CHD under age 13, diabetes, smoke, have hypertension or peripheral artery disease:  no At any time, has a partner hit, kicked or otherwise hurt or frightened you?: no Over the past 2 weeks, have you felt down, depressed or hopeless?: no Over the past 2 weeks, have you felt little interest or pleasure in doing things?:no   Gynecologic History Patient's last menstrual period was 09/25/2015. Contraception: OCP (estrogen/progesterone) Last Pap: 2016. Results were: normal Last mammogram: n/a. Results were: n/a  Obstetric History OB History  Gravida Para Term Preterm AB SAB TAB Ectopic Multiple Living         Past Medical History  Diagnosis Date  . Psoriasis     History reviewed. No pertinent past surgical history.   Current outpatient prescriptions:  .  phentermine 37.5 MG capsule, Take 1 capsule (37.5 mg total) by mouth every morning., Disp: 30 capsule, Rfl: 2 .  ibuprofen (ADVIL,MOTRIN) 800 MG tablet, Take 1 tablet (800 mg total) by mouth every 8 (eight) hours as needed for mild pain or moderate pain. (Patient not taking: Reported on 11/05/2015), Disp: 30 tablet, Rfl: 8 No Known Allergies  Social History  Substance Use Topics  . Smoking status: Former Games developer   . Smokeless tobacco: Never Used  . Alcohol Use: 0.0 oz/week    0 Standard drinks or equivalent per week     Comment: Ocassionally    Family History  Problem Relation Age of Onset  . Hypertension Father   . Hypothyroidism Mother       Review of Systems  Constitutional: negative for fatigue and weight loss Respiratory: negative for cough and wheezing Cardiovascular: negative for chest pain, fatigue and palpitations Gastrointestinal: negative for abdominal pain and change in bowel habits Musculoskeletal:negative for myalgias Neurological: negative for gait problems and tremors Behavioral/Psych: negative for abusive relationship, depression Endocrine: negative for temperature intolerance   Genitourinary: positive for abnormal menstrual periods, and negative for genital lesions, hot flashes, sexual problems and vaginal discharge Integument/breast: negative for breast lump, breast tenderness, nipple discharge and skin lesion(s)    Objective:       BP 114/74 mmHg  Pulse 80  Temp(Src) 98.4 F (36.9 C)  Wt 194 lb (87.998 kg)  LMP 09/25/2015 General:   alert  Skin:   no rash or abnormalities  Lungs:   clear to auscultation bilaterally  Heart:   regular rate and rhythm, S1, S2 normal, no murmur, click, rub or gallop  Breasts:   normal without suspicious masses, skin or nipple changes or axillary nodes  Abdomen:  normal findings: no organomegaly, soft, non-tender and no hernia  Pelvis:  External genitalia: normal general appearance Urinary system: urethral meatus normal and bladder without fullness, nontender Vaginal: normal without tenderness, induration or masses Cervix: normal  appearance Adnexa: normal bimanual exam Uterus: anteverted and non-tender, normal size   Lab Review Urine pregnancy test Labs reviewed yes Radiologic studies reviewed no  Assessment:  Healthy female  Contraceptive counseling and advice.  Poor compliance with OCP's.   Plan:   Educational  material on contraceptive choices dispensed  Education reviewed: low fat, low cholesterol diet, safe sex/STD prevention, self breast exams and weight bearing exercise. Contraception: Considering options. Follow up in: 1 year.   No orders of the defined types were placed in this encounter.   Orders Placed This Encounter  Procedures  . NuSwab Vaginitis Plus (VG+)  . Ambulatory referral to Internal Medicine    Referral Priority:  Routine    Referral Type:  Consultation    Referral Reason:  Specialty Services Required    Requested Specialty:  Internal Medicine    Number of Visits Requested:  1

## 2015-11-05 NOTE — Patient Instructions (Signed)
Contraception Choices Contraception (birth control) is the use of any methods or devices to prevent pregnancy. Below are some methods to help avoid pregnancy. HORMONAL METHODS   Contraceptive implant. This is a thin, plastic tube containing progesterone hormone. It does not contain estrogen hormone. Your health care provider inserts the tube in the inner part of the upper arm. The tube can remain in place for up to 3 years. After 3 years, the implant must be removed. The implant prevents the ovaries from releasing an egg (ovulation), thickens the cervical mucus to prevent sperm from entering the uterus, and thins the lining of the inside of the uterus.  Progesterone-only injections. These injections are given every 3 months by your health care provider to prevent pregnancy. This synthetic progesterone hormone stops the ovaries from releasing eggs. It also thickens cervical mucus and changes the uterine lining. This makes it harder for sperm to survive in the uterus.  Birth control pills. These pills contain estrogen and progesterone hormone. They work by preventing the ovaries from releasing eggs (ovulation). They also cause the cervical mucus to thicken, preventing the sperm from entering the uterus. Birth control pills are prescribed by a health care provider.Birth control pills can also be used to treat heavy periods.  Minipill. This type of birth control pill contains only the progesterone hormone. They are taken every day of each month and must be prescribed by your health care provider.  Birth control patch. The patch contains hormones similar to those in birth control pills. It must be changed once a week and is prescribed by a health care provider.  Vaginal ring. The ring contains hormones similar to those in birth control pills. It is left in the vagina for 3 weeks, removed for 1 week, and then a new one is put back in place. The patient must be comfortable inserting and removing the ring  from the vagina.A health care provider's prescription is necessary.  Emergency contraception. Emergency contraceptives prevent pregnancy after unprotected sexual intercourse. This pill can be taken right after sex or up to 5 days after unprotected sex. It is most effective the sooner you take the pills after having sexual intercourse. Most emergency contraceptive pills are available without a prescription. Check with your pharmacist. Do not use emergency contraception as your only form of birth control. BARRIER METHODS   Female condom. This is a thin sheath (latex or rubber) that is worn over the penis during sexual intercourse. It can be used with spermicide to increase effectiveness.  Female condom. This is a soft, loose-fitting sheath that is put into the vagina before sexual intercourse.  Diaphragm. This is a soft, latex, dome-shaped barrier that must be fitted by a health care provider. It is inserted into the vagina, along with a spermicidal jelly. It is inserted before intercourse. The diaphragm should be left in the vagina for 6 to 8 hours after intercourse.  Cervical cap. This is a round, soft, latex or plastic cup that fits over the cervix and must be fitted by a health care provider. The cap can be left in place for up to 48 hours after intercourse.  Sponge. This is a soft, circular piece of polyurethane foam. The sponge has spermicide in it. It is inserted into the vagina after wetting it and before sexual intercourse.  Spermicides. These are chemicals that kill or block sperm from entering the cervix and uterus. They come in the form of creams, jellies, suppositories, foam, or tablets. They do not require a   prescription. They are inserted into the vagina with an applicator before having sexual intercourse. The process must be repeated every time you have sexual intercourse. INTRAUTERINE CONTRACEPTION  Intrauterine device (IUD). This is a T-shaped device that is put in a woman's uterus  during a menstrual period to prevent pregnancy. There are 2 types:  Copper IUD. This type of IUD is wrapped in copper wire and is placed inside the uterus. Copper makes the uterus and fallopian tubes produce a fluid that kills sperm. It can stay in place for 10 years.  Hormone IUD. This type of IUD contains the hormone progestin (synthetic progesterone). The hormone thickens the cervical mucus and prevents sperm from entering the uterus, and it also thins the uterine lining to prevent implantation of a fertilized egg. The hormone can weaken or kill the sperm that get into the uterus. It can stay in place for 3-5 years, depending on which type of IUD is used. PERMANENT METHODS OF CONTRACEPTION  Female tubal ligation. This is when the woman's fallopian tubes are surgically sealed, tied, or blocked to prevent the egg from traveling to the uterus.  Hysteroscopic sterilization. This involves placing a small coil or insert into each fallopian tube. Your doctor uses a technique called hysteroscopy to do the procedure. The device causes scar tissue to form. This results in permanent blockage of the fallopian tubes, so the sperm cannot fertilize the egg. It takes about 3 months after the procedure for the tubes to become blocked. You must use another form of birth control for these 3 months.  Female sterilization. This is when the female has the tubes that carry sperm tied off (vasectomy).This blocks sperm from entering the vagina during sexual intercourse. After the procedure, the man can still ejaculate fluid (semen). NATURAL PLANNING METHODS  Natural family planning. This is not having sexual intercourse or using a barrier method (condom, diaphragm, cervical cap) on days the woman could become pregnant.  Calendar method. This is keeping track of the length of each menstrual cycle and identifying when you are fertile.  Ovulation method. This is avoiding sexual intercourse during ovulation.  Symptothermal  method. This is avoiding sexual intercourse during ovulation, using a thermometer and ovulation symptoms.  Post-ovulation method. This is timing sexual intercourse after you have ovulated. Regardless of which type or method of contraception you choose, it is important that you use condoms to protect against the transmission of sexually transmitted infections (STIs). Talk with your health care provider about which form of contraception is most appropriate for you.   This information is not intended to replace advice given to you by your health care provider. Make sure you discuss any questions you have with your health care provider.   Document Released: 08/08/2005 Document Revised: 08/13/2013 Document Reviewed: 01/31/2013 Elsevier Interactive Patient Education [employer] Contraception Choices Contraception (birth control) is the use of any methods or devices to prevent pregnancy. Below are some methods to help avoid pregnancy. HORMONAL METHODS   Contraceptive implant. This is a thin, plastic tube containing progesterone hormone. It does not contain estrogen hormone. Your health care provider inserts the tube in the inner part of the upper arm. The tube can remain in place for up to 3 years. After 3 years, the implant must be removed. The implant prevents the ovaries from releasing an egg (ovulation), thickens the cervical mucus to prevent sperm from entering the uterus, and thins the lining of the inside of the uterus.  Progesterone-only injections. These injections are  given every 3 months by your health care provider to prevent pregnancy. This synthetic progesterone hormone stops the ovaries from releasing eggs. It also thickens cervical mucus and changes the uterine lining. This makes it harder for sperm to survive in the uterus.  Birth control pills. These pills contain estrogen and progesterone hormone. They work by preventing the ovaries from releasing eggs (ovulation). They also  cause the cervical mucus to thicken, preventing the sperm from entering the uterus. Birth control pills are prescribed by a health care provider.Birth control pills can also be used to treat heavy periods.  Minipill. This type of birth control pill contains only the progesterone hormone. They are taken every day of each month and must be prescribed by your health care provider.  Birth control patch. The patch contains hormones similar to those in birth control pills. It must be changed once a week and is prescribed by a health care provider.  Vaginal ring. The ring contains hormones similar to those in birth control pills. It is left in the vagina for 3 weeks, removed for 1 week, and then a new one is put back in place. The patient must be comfortable inserting and removing the ring from the vagina.A health care provider's prescription is necessary.  Emergency contraception. Emergency contraceptives prevent pregnancy after unprotected sexual intercourse. This pill can be taken right after sex or up to 5 days after unprotected sex. It is most effective the sooner you take the pills after having sexual intercourse. Most emergency contraceptive pills are available without a prescription. Check with your pharmacist. Do not use emergency contraception as your only form of birth control. BARRIER METHODS   Female condom. This is a thin sheath (latex or rubber) that is worn over the penis during sexual intercourse. It can be used with spermicide to increase effectiveness.  Female condom. This is a soft, loose-fitting sheath that is put into the vagina before sexual intercourse.  Diaphragm. This is a soft, latex, dome-shaped barrier that must be fitted by a health care provider. It is inserted into the vagina, along with a spermicidal jelly. It is inserted before intercourse. The diaphragm should be left in the vagina for 6 to 8 hours after intercourse.  Cervical cap. This is a round, soft, latex or plastic  cup that fits over the cervix and must be fitted by a health care provider. The cap can be left in place for up to 48 hours after intercourse.  Sponge. This is a soft, circular piece of polyurethane foam. The sponge has spermicide in it. It is inserted into the vagina after wetting it and before sexual intercourse.  Spermicides. These are chemicals that kill or block sperm from entering the cervix and uterus. They come in the form of creams, jellies, suppositories, foam, or tablets. They do not require a prescription. They are inserted into the vagina with an applicator before having sexual intercourse. The process must be repeated every time you have sexual intercourse. INTRAUTERINE CONTRACEPTION  Intrauterine device (IUD). This is a T-shaped device that is put in a woman's uterus during a menstrual period to prevent pregnancy. There are 2 types:  Copper IUD. This type of IUD is wrapped in copper wire and is placed inside the uterus. Copper makes the uterus and fallopian tubes produce a fluid that kills sperm. It can stay in place for 10 years.  Hormone IUD. This type of IUD contains the hormone progestin (synthetic progesterone). The hormone thickens the cervical mucus and prevents sperm  from entering the uterus, and it also thins the uterine lining to prevent implantation of a fertilized egg. The hormone can weaken or kill the sperm that get into the uterus. It can stay in place for 3-5 years, depending on which type of IUD is used. PERMANENT METHODS OF CONTRACEPTION  Female tubal ligation. This is when the woman's fallopian tubes are surgically sealed, tied, or blocked to prevent the egg from traveling to the uterus.  Hysteroscopic sterilization. This involves placing a small coil or insert into each fallopian tube. Your doctor uses a technique called hysteroscopy to do the procedure. The device causes scar tissue to form. This results in permanent blockage of the fallopian tubes, so the sperm  cannot fertilize the egg. It takes about 3 months after the procedure for the tubes to become blocked. You must use another form of birth control for these 3 months.  Female sterilization. This is when the female has the tubes that carry sperm tied off (vasectomy).This blocks sperm from entering the vagina during sexual intercourse. After the procedure, the man can still ejaculate fluid (semen). NATURAL PLANNING METHODS  Natural family planning. This is not having sexual intercourse or using a barrier method (condom, diaphragm, cervical cap) on days the woman could become pregnant.  Calendar method. This is keeping track of the length of each menstrual cycle and identifying when you are fertile.  Ovulation method. This is avoiding sexual intercourse during ovulation.  Symptothermal method. This is avoiding sexual intercourse during ovulation, using a thermometer and ovulation symptoms.  Post-ovulation method. This is timing sexual intercourse after you have ovulated. Regardless of which type or method of contraception you choose, it is important that you use condoms to protect against the transmission of sexually transmitted infections (STIs). Talk with your health care provider about which form of contraception is most appropriate for you.   This information is not intended to replace advice given to you by your health care provider. Make sure you discuss any questions you have with your health care provider.   Document Released: 08/08/2005 Document Revised: 08/13/2013 Document Reviewed: 01/31/2013 Elsevier Interactive Patient Education [employer] Inc[employer]

## 2015-11-07 ENCOUNTER — Other Ambulatory Visit: Payer: Self-pay | Admitting: Obstetrics

## 2015-11-07 DIAGNOSIS — B3731 Acute candidiasis of vulva and vagina: Secondary | ICD-10-CM

## 2015-11-07 DIAGNOSIS — B373 Candidiasis of vulva and vagina: Secondary | ICD-10-CM

## 2015-11-07 LAB — NUSWAB VAGINITIS PLUS (VG+)
Candida albicans, NAA: POSITIVE — AB
Candida glabrata, NAA: NEGATIVE
Chlamydia trachomatis, NAA: NEGATIVE
NEISSERIA GONORRHOEAE, NAA: NEGATIVE
Trich vag by NAA: NEGATIVE

## 2015-11-07 MED ORDER — FLUCONAZOLE 150 MG PO TABS: 150.0000 mg | ORAL_TABLET | Freq: Once | ORAL | Status: DC

## 2015-11-09 LAB — PAP IG W/ RFLX HPV ASCU: PAP Smear Comment: 0

## 2015-12-01 DIAGNOSIS — R21 Rash and other nonspecific skin eruption: Secondary | ICD-10-CM | POA: Diagnosis not present

## 2015-12-01 DIAGNOSIS — J069 Acute upper respiratory infection, unspecified: Secondary | ICD-10-CM | POA: Diagnosis not present

## 2015-12-28 DIAGNOSIS — L409 Psoriasis, unspecified: Secondary | ICD-10-CM | POA: Diagnosis not present

## 2016-01-04 DIAGNOSIS — D485 Neoplasm of uncertain behavior of skin: Secondary | ICD-10-CM | POA: Diagnosis not present

## 2016-01-04 DIAGNOSIS — L418 Other parapsoriasis: Secondary | ICD-10-CM | POA: Diagnosis not present

## 2016-01-21 ENCOUNTER — Ambulatory Visit (INDEPENDENT_AMBULATORY_CARE_PROVIDER_SITE_OTHER): Payer: BLUE CROSS/BLUE SHIELD | Admitting: Internal Medicine

## 2016-01-21 ENCOUNTER — Encounter: Payer: Self-pay | Admitting: Internal Medicine

## 2016-01-21 ENCOUNTER — Other Ambulatory Visit (INDEPENDENT_AMBULATORY_CARE_PROVIDER_SITE_OTHER): Payer: BLUE CROSS/BLUE SHIELD

## 2016-01-21 VITALS — BP 112/78 | HR 77 | Temp 98.2°F | Resp 16 | Ht 63.0 in | Wt 198.0 lb

## 2016-01-21 DIAGNOSIS — Z139 Encounter for screening, unspecified: Secondary | ICD-10-CM

## 2016-01-21 DIAGNOSIS — G43009 Migraine without aura, not intractable, without status migrainosus: Secondary | ICD-10-CM | POA: Diagnosis not present

## 2016-01-21 DIAGNOSIS — G43909 Migraine, unspecified, not intractable, without status migrainosus: Secondary | ICD-10-CM | POA: Insufficient documentation

## 2016-01-21 DIAGNOSIS — Z23 Encounter for immunization: Secondary | ICD-10-CM

## 2016-01-21 DIAGNOSIS — E669 Obesity, unspecified: Secondary | ICD-10-CM

## 2016-01-21 LAB — COMPREHENSIVE METABOLIC PANEL
ALBUMIN: 4.6 g/dL (ref 3.5–5.2)
ALK PHOS: 50 U/L (ref 39–117)
ALT: 19 U/L (ref 0–35)
AST: 22 U/L (ref 0–37)
BILIRUBIN TOTAL: 0.3 mg/dL (ref 0.2–1.2)
BUN: 12 mg/dL (ref 6–23)
CALCIUM: 9.5 mg/dL (ref 8.4–10.5)
CO2: 29 meq/L (ref 19–32)
CREATININE: 0.98 mg/dL (ref 0.40–1.20)
Chloride: 103 mEq/L (ref 96–112)
GFR: 86.17 mL/min (ref >60.00)
Glucose, Bld: 95 mg/dL (ref 70–99)
Potassium: 4.4 mEq/L (ref 3.5–5.1)
Sodium: 137 mEq/L (ref 135–145)
Total Protein: 7.7 g/dL (ref 6.0–8.3)

## 2016-01-21 LAB — CBC WITH DIFFERENTIAL/PLATELET
BASOS ABS: 0 10*3/uL (ref 0.0–0.1)
BASOS PCT: 0.6 % (ref 0.0–3.0)
Eosinophils Absolute: 0.1 10*3/uL (ref 0.0–0.7)
Eosinophils Relative: 2.5 % (ref 0.0–5.0)
HEMATOCRIT: 39.2 % (ref 36.0–46.0)
HEMOGLOBIN: 13 g/dL (ref 12.0–15.0)
LYMPHS PCT: 45.8 % (ref 12.0–46.0)
Lymphs Abs: 1.8 10*3/uL (ref 0.7–4.0)
MCHC: 33.2 g/dL (ref 30.0–36.0)
MCV: 86.3 fl (ref 78.0–100.0)
MONOS PCT: 7.1 % (ref 3.0–12.0)
Monocytes Absolute: 0.3 10*3/uL (ref 0.1–1.0)
NEUTROS ABS: 1.7 10*3/uL (ref 1.4–7.7)
Neutrophils Relative %: 44 % (ref 43.0–77.0)
PLATELETS: 314 10*3/uL (ref 150.0–400.0)
RBC: 4.54 Mil/uL (ref 3.87–5.11)
RDW: 14.4 % (ref 11.5–15.5)
WBC: 3.9 10*3/uL — AB (ref 4.0–10.5)

## 2016-01-21 LAB — LIPID PANEL
CHOL/HDL RATIO: 3
CHOLESTEROL: 204 mg/dL — AB (ref 0–200)
HDL: 69.7 mg/dL (ref >39.00)
LDL Cholesterol: 120 mg/dL — ABNORMAL HIGH (ref 0–99)
NonHDL: 134.36
TRIGLYCERIDES: 73 mg/dL (ref 0.0–149.0)
VLDL: 14.6 mg/dL (ref 0.0–40.0)

## 2016-01-21 LAB — TSH: TSH: 2.07 u[IU]/mL (ref 0.35–4.50)

## 2016-01-21 MED ORDER — SUMATRIPTAN SUCCINATE 100 MG PO TABS: 100.0000 mg | ORAL_TABLET | ORAL | Status: DC | PRN

## 2016-01-21 NOTE — Assessment & Plan Note (Signed)
Stressed the importance of regular exercise Discussed the importance of decreasing portions-she is consuming too many calories Discussed seeing a nutritionist Discussed using Apps on Her Phone to Help Her count calories

## 2016-01-21 NOTE — Progress Notes (Signed)
Pre visit review using our clinic review tool, if applicable. No additional management support is needed unless otherwise documented below in the visit note. 

## 2016-01-21 NOTE — Progress Notes (Signed)
Subjective:    Patient ID: Amanda Peters, female    DOB: 09/05/1985, 30 y.o.   MRN: [id number]  HPI She is here to establish with a new pcp.    Dermatitis,psoriasis:  She is following with dermatology.  She is using topical steroids for her psoriasis.  Migraine headaches:  She can get them multiple times a month.  She has not had them evaluated.  If she is not nauseous she will try taking advil, which sometimes helps.  Sleeping helps.  She did just get glasses and thinks they have helped.  She has never been on any migraine medication and wonders if that would help more than the ibuprofen.  Obesity:  She has taken phentermine.  It has helped in the past.  She does go to the gym and was going 4 times a week, but she is not going that often.  She drinks water and sprite one a day, lemonade and  three glasses of juice.  breakfast - pop tart, eggs/grits/turkey bacon, oatmeal or eggs with cheese and toast.   Lunch: sometimes skips lunch, sometimes eats wings,fries, steak or chicken kabob with rice/pita.  Dinner: cereal, wings and broccoli/corn/mashed potatoes or lasagna.    Medications and allergies reviewed with patient and updated if appropriate.  Patient Active Problem List   Diagnosis Date Noted  . Candidiasis of vulva and vagina 01/15/2014  . Dermatitis 10/28/2013  . Obesity 10/28/2013    Current Outpatient Prescriptions on File Prior to Visit  Medication Sig Dispense Refill  . ibuprofen (ADVIL,MOTRIN) 800 MG tablet Take 1 tablet (800 mg total) by mouth every 8 (eight) hours as needed for mild pain or moderate pain. 30 tablet 8  . phentermine 37.5 MG capsule Take 1 capsule (37.5 mg total) by mouth every morning. 30 capsule 2   No current facility-administered medications on file prior to visit.    Past Medical History  Diagnosis Date  . Psoriasis     No past surgical history on file.  Social History   Social History  . Marital Status: Single    Spouse Name: N/A  .  Number of Children: N/A  . Years of Education: N/A   Social History Main Topics  . Smoking status: Former Games developer  . Smokeless tobacco: Never Used  . Alcohol Use: 0.0 oz/week    0 Standard drinks or equivalent per week     Comment: Ocassionally  . Drug Use: No  . Sexual Activity:    Partners: Male    Birth Control/ Protection:    Other Topics Concern  . None   Social History Narrative    Family History  Problem Relation Age of Onset  . Hypertension Father   . Hypothyroidism Mother     Review of Systems  Constitutional: Negative for fever and chills.  Eyes: Negative for visual disturbance.  Respiratory: Negative for cough, shortness of breath and wheezing.   Cardiovascular: Negative for chest pain, palpitations and leg swelling.  Gastrointestinal: Negative for abdominal pain, diarrhea, constipation and blood in stool.       No gerd  Genitourinary: Negative for dysuria and hematuria.  Musculoskeletal: Positive for back pain (upper back on occasion from large breasts, occasional lower back pain). Negative for myalgias and arthralgias.  Neurological: Positive for dizziness (with migraines), light-headedness (with migraines) and headaches.  Psychiatric/Behavioral: Negative for dysphoric mood. The patient is not nervous/anxious.        Objective:   Filed Vitals:   01/21/16 1008  BP:  112/78  Pulse: 77  Temp: 98.2 F (36.8 C)  Resp: 16   Filed Weights   01/21/16 1008  Weight: 198 lb (89.812 kg)   Body mass index is 35.08 kg/(m^2).   Physical Exam  Constitutional: She is oriented to person, place, and time. She appears well-developed and well-nourished. No distress.  HENT:  Head: Normocephalic and atraumatic.  Right Ear: External ear normal.  Left Ear: External ear normal.  Mouth/Throat: Oropharynx is clear and moist.  Eyes: Conjunctivae are normal.  Neck: Neck supple. No tracheal deviation present. Thyromegaly (Possible goiter) present.  Cardiovascular: Normal  rate, regular rhythm and normal heart sounds.   No murmur heard. Pulmonary/Chest: Effort normal and breath sounds normal. No respiratory distress. She has no wheezes. She has no rales.  Abdominal: Soft. She exhibits no distension. There is no tenderness. There is no rebound.  Musculoskeletal: She exhibits no edema.  Lymphadenopathy:    She has no cervical adenopathy.  Neurological: She is alert and oriented to person, place, and time.  Skin: Skin is warm and dry. She is not diaphoretic.  Psychiatric: She has a normal mood and affect. Her behavior is normal.          Assessment & Plan:    See Problem List for Assessment and Plan of chronic medical problems.  Follow up as needed

## 2016-01-21 NOTE — Patient Instructions (Signed)
  Test(s) ordered today. Your results will be released to MyChart (or called to you) after review, usually within 72hours after test completion. If any changes need to be made, you will be notified at that same time.   Medications reviewed and updated.  Changes include trying a migraine medication called imitrex.   Your prescription(s) have been submitted to your pharmacy. Please take as directed and contact our office if you believe you are having problem(s) with the medication(s).   If you have any questions or concerns please call.

## 2016-01-21 NOTE — Assessment & Plan Note (Signed)
She can continue to take Advil, try Excedrin Migraine and I will also prescribe Imitrex for her to try At times her migraines can be debilitating and she would like to consider FMLA in case she needs to leave work. She will drop off the paperwork. We'll give her 1 and 2 days a month, one day each episode. Can consider a different triptan if sumatriptan does not work Consider neurology referral depending on how effective the above medication is

## 2016-02-15 DIAGNOSIS — L709 Acne, unspecified: Secondary | ICD-10-CM | POA: Diagnosis not present

## 2016-02-15 DIAGNOSIS — L409 Psoriasis, unspecified: Secondary | ICD-10-CM | POA: Diagnosis not present

## 2016-03-24 ENCOUNTER — Ambulatory Visit (INDEPENDENT_AMBULATORY_CARE_PROVIDER_SITE_OTHER): Payer: BLUE CROSS/BLUE SHIELD | Admitting: Obstetrics

## 2016-03-24 ENCOUNTER — Encounter: Payer: Self-pay | Admitting: Obstetrics

## 2016-03-24 VITALS — BP 123/74 | HR 95 | Ht 62.25 in | Wt 195.1 lb

## 2016-03-24 DIAGNOSIS — N898 Other specified noninflammatory disorders of vagina: Secondary | ICD-10-CM | POA: Diagnosis not present

## 2016-03-24 MED ORDER — METRONIDAZOLE 500 MG PO TABS
500.0000 mg | ORAL_TABLET | Freq: Two times a day (BID) | ORAL | 2 refills | Status: DC
Start: 2016-03-24 — End: 2016-07-20

## 2016-03-27 ENCOUNTER — Other Ambulatory Visit: Payer: Self-pay | Admitting: Obstetrics

## 2016-03-27 LAB — NUSWAB VG+, CANDIDA 6SP
ATOPOBIUM VAGINAE: HIGH {score} — AB
CANDIDA KRUSEI, NAA: NEGATIVE
CANDIDA TROPICALIS, NAA: NEGATIVE
CHLAMYDIA TRACHOMATIS, NAA: NEGATIVE
Candida albicans, NAA: NEGATIVE
Candida glabrata, NAA: NEGATIVE
Candida lusitaniae, NAA: NEGATIVE
Candida parapsilosis, NAA: NEGATIVE
MEGASPHAERA 1: HIGH {score} — AB
Neisseria gonorrhoeae, NAA: NEGATIVE
TRICH VAG BY NAA: NEGATIVE

## 2016-03-28 ENCOUNTER — Encounter: Payer: Self-pay | Admitting: Obstetrics

## 2016-03-28 NOTE — Progress Notes (Signed)
Patient ID: Amanda Peters, female   DOB: 1985/09/18, 30 y.o.   MRN: [id number]  Chief Complaint  Patient presents with  . Vaginal Discharge    foul dodr, milky white, denies itching/irritation in vaginal area    HPI Amanda Peters is a 30 y.o. female.  Malodorous vaginal discharge. HPI  Past Medical History:  Diagnosis Date  . Psoriasis     Past Surgical History:  Procedure Laterality Date  . NO PAST SURGERIES      Family History  Problem Relation Age of Onset  . Hypertension Father   . Hypothyroidism Mother     Social History Social History  Substance Use Topics  . Smoking status: Current Every Day Smoker    Types: Cigarettes  . Smokeless tobacco: Never Used  . Alcohol use 0.0 oz/week     Comment: Ocassionally    No Known Allergies  Current Outpatient Prescriptions  Medication Sig Dispense Refill  . desonide (DESOWEN) 0.05 % cream     . EPIDUO FORTE 0.3-2.5 % GEL Apply 1 application topically at bedtime.    Marland Kitchen ibuprofen (ADVIL,MOTRIN) 800 MG tablet Take 1 tablet (800 mg total) by mouth every 8 (eight) hours as needed for mild pain or moderate pain. 30 tablet 8  . phentermine 37.5 MG capsule Take 37.5 mg by mouth every morning.    . SUMAtriptan (IMITREX) 100 MG tablet Take 1 tablet (100 mg total) by mouth every 2 (two) hours as needed for migraine. May repeat in 2 hours if headache persists or recurs. 10 tablet 5  . metroNIDAZOLE (FLAGYL) 500 MG tablet Take 1 tablet (500 mg total) by mouth 2 (two) times daily. 14 tablet 2   No current facility-administered medications for this visit.     Review of Systems Review of Systems Constitutional: negative for fatigue and weight loss Respiratory: negative for cough and wheezing Cardiovascular: negative for chest pain, fatigue and palpitations Gastrointestinal: negative for abdominal pain and change in bowel habits Genitourinary:positive for malodorous vaginal discharge Integument/breast: negative for nipple  discharge Musculoskeletal:negative for myalgias Neurological: negative for gait problems and tremors Behavioral/Psych: negative for abusive relationship, depression Endocrine: negative for temperature intolerance     Blood pressure 123/74, pulse 95, height 5' 2.25" (1.581 m), weight 195 lb 1.6 oz (88.5 kg), last menstrual period 03/15/2016.  Physical Exam Physical Exam General:   alert  Skin:   no rash or abnormalities  Lungs:   clear to auscultation bilaterally  Heart:   regular rate and rhythm, S1, S2 normal, no murmur, click, rub or gallop  Breasts:   normal without suspicious masses, skin or nipple changes or axillary nodes  Abdomen:  normal findings: no organomegaly, soft, non-tender and no hernia  Pelvis:  External genitalia: normal general appearance Urinary system: urethral meatus normal and bladder without fullness, nontender Vaginal: normal without tenderness, induration or masses Cervix: normal appearance Adnexa: normal bimanual exam Uterus: anteverted and non-tender, normal size      Data Reviewed Labs  Assessment     BV    Plan    Flagyl Rx F/U prn  No orders of the defined types were placed in this encounter.  Meds ordered this encounter  Medications  . EPIDUO FORTE 0.3-2.5 % GEL    Sig: Apply 1 application topically at bedtime.  . phentermine 37.5 MG capsule    Sig: Take 37.5 mg by mouth every morning.  . metroNIDAZOLE (FLAGYL) 500 MG tablet    Sig: Take 1 tablet (500 mg total) by mouth  2 (two) times daily.    Dispense:  14 tablet    Refill:  2

## 2016-04-21 DIAGNOSIS — L400 Psoriasis vulgaris: Secondary | ICD-10-CM | POA: Diagnosis not present

## 2016-05-23 DIAGNOSIS — Z79899 Other long term (current) drug therapy: Secondary | ICD-10-CM | POA: Diagnosis not present

## 2016-05-23 DIAGNOSIS — L409 Psoriasis, unspecified: Secondary | ICD-10-CM | POA: Diagnosis not present

## 2016-05-23 DIAGNOSIS — L400 Psoriasis vulgaris: Secondary | ICD-10-CM | POA: Diagnosis not present

## 2016-06-26 ENCOUNTER — Emergency Department (HOSPITAL_COMMUNITY)
Admission: EM | Admit: 2016-06-26 | Discharge: 2016-06-26 | Disposition: A | Discharge status: Home / Self Care | Payer: BLUE CROSS/BLUE SHIELD | Attending: Emergency Medicine | Admitting: Emergency Medicine

## 2016-06-26 ENCOUNTER — Encounter (HOSPITAL_COMMUNITY): Payer: Self-pay | Admitting: *Deleted

## 2016-06-26 DIAGNOSIS — R112 Nausea with vomiting, unspecified: Secondary | ICD-10-CM

## 2016-06-26 DIAGNOSIS — F1290 Cannabis use, unspecified, uncomplicated: Secondary | ICD-10-CM | POA: Diagnosis not present

## 2016-06-26 DIAGNOSIS — F17210 Nicotine dependence, cigarettes, uncomplicated: Secondary | ICD-10-CM | POA: Diagnosis not present

## 2016-06-26 DIAGNOSIS — F410 Panic disorder [episodic paroxysmal anxiety] without agoraphobia: Secondary | ICD-10-CM | POA: Diagnosis not present

## 2016-06-26 MED ORDER — ONDANSETRON HCL 4 MG/2ML IJ SOLN
INTRAMUSCULAR | Status: AC
  Filled 2016-06-26: qty 2

## 2016-06-26 NOTE — ED Triage Notes (Signed)
Pt had a marijuana brownie around 2300, reports numbness to body and anxiety since. Pt hyperventilating and c/o nausea at present

## 2016-06-26 NOTE — ED Provider Notes (Signed)
[facility]-EMERGENCY DEPT Provider Note   CSN: [id number] Arrival date & time: 06/26/16  [id number]   By signing my name below, I, Valentino SaxonBianca Contreras, attest that this documentation has been prepared under the direction and in the presence of Gilda Creasehristopher J Katrinna Travieso, MD. Electronically Signed: Valentino SaxonBianca Contreras, ED Scribe. 06/26/16. 2:48 AM.  History   Chief Complaint Chief Complaint  Patient presents with  . Medication Reaction   The history is provided by the patient. No language interpreter was used.   HPI Comments: Amanda Peters is a 30 y.o. female who presents to the Emergency Department complaining of sudden onset medical reaction to marijuana that occurred yesterday evening. Pt notes she ingested a small portion of a weed brownie and then smoked afterwards. She reports associated anxiousness with accompanied tachycardia and restlessness.  Prior to admission, Pt's friend asked the front desk how long the wait time would be, when Pt was informed that this was unknown, she then began to scream. Pt then had an episode of emesis and fell in her pool of vomit. She proceeded to roll in her vomit while crying.  She denies head injury or trauma. Pt also denies cough and HA. Pt reports no modifying factors noted. No additional complaints at this time.    Past Medical History:  Diagnosis Date  . Psoriasis     Patient Active Problem List   Diagnosis Date Noted  . Migraines 01/21/2016  . Dermatitis 10/28/2013  . Obesity 10/28/2013    Past Surgical History:  Procedure Laterality Date  . NO PAST SURGERIES      OB History    Gravida Para Term Preterm AB Living   0 0 0 0 0 0   SAB TAB Ectopic Multiple Live Births   0 0 0 0         Home Medications    Prior to Admission medications   Medication Sig Start Date End Date Taking? Authorizing Provider  desonide (DESOWEN) 0.05 % cream  01/09/16   Historical Provider, MD  EPIDUO FORTE 0.3-2.5 % GEL Apply 1 application topically at bedtime. 02/18/16    Historical Provider, MD  ibuprofen (ADVIL,MOTRIN) 800 MG tablet Take 1 tablet (800 mg total) by mouth every 8 (eight) hours as needed for mild pain or moderate pain. 10/06/15   Brock Badharles A Harper, MD  metroNIDAZOLE (FLAGYL) 500 MG tablet Take 1 tablet (500 mg total) by mouth 2 (two) times daily. 03/24/16   Brock Badharles A Harper, MD  phentermine 37.5 MG capsule Take 37.5 mg by mouth every morning.    Historical Provider, MD  SUMAtriptan (IMITREX) 100 MG tablet Take 1 tablet (100 mg total) by mouth every 2 (two) hours as needed for migraine. May repeat in 2 hours if headache persists or recurs. 01/21/16   Pincus SanesStacy J Burns, MD    Family History Family History  Problem Relation Age of Onset  . Hypothyroidism Mother   . Hypertension Father     Social History Social History  Substance Use Topics  . Smoking status: Current Every Day Smoker    Types: Cigarettes  . Smokeless tobacco: Never Used  . Alcohol use 0.0 oz/week     Comment: Ocassionally     Allergies   Patient has no known allergies.   Review of Systems Review of Systems  Respiratory: Negative for cough.   Gastrointestinal: Positive for vomiting.  Neurological: Negative for headaches.  Psychiatric/Behavioral: The patient is nervous/anxious.      Physical Exam Updated Vital Signs BP 101/71 (BP Location:  Right Arm)   Pulse (!) 124   Resp 16   LMP 06/12/2016   SpO2 100%   Physical Exam  Constitutional: She is oriented to person, place, and time. She appears well-developed and well-nourished. No distress.  HENT:  Head: Normocephalic and atraumatic.  Right Ear: Hearing normal.  Left Ear: Hearing normal.  Nose: Nose normal.  Mouth/Throat: Oropharynx is clear and moist and mucous membranes are normal.  Eyes: Conjunctivae and EOM are normal. Pupils are equal, round, and reactive to light.  Neck: Normal range of motion. Neck supple.  Cardiovascular: Regular rhythm, S1 normal and S2 normal.  Exam reveals no gallop and no friction  rub.   No murmur heard. Pulmonary/Chest: Effort normal and breath sounds normal. No respiratory distress. She exhibits no tenderness.  Abdominal: Soft. Normal appearance and bowel sounds are normal. There is no hepatosplenomegaly. There is no tenderness. There is no rebound, no guarding, no tenderness at McBurney's point and negative Murphy's sign. No hernia.  Musculoskeletal: Normal range of motion.  Neurological: She is oriented to person, place, and time. She has normal strength. No cranial nerve deficit or sensory deficit. Coordination normal. GCS eye subscore is 4. GCS verbal subscore is 5. GCS motor subscore is 6.  Sleepy but oriented and answers all questions appropriately  Skin: Skin is warm, dry and intact. No rash noted. No cyanosis.  Psychiatric: She has a normal mood and affect. Her speech is normal and behavior is normal. Thought content normal.  Nursing note and vitals reviewed.    ED Treatments / Results   DIAGNOSTIC STUDIES: Oxygen Saturation is 100% on RA, normal by my interpretation.    COORDINATION OF CARE: 2:46 AM Discussed treatment plan with pt at bedside which includes EKG and pt agreed to plan.  Labs (all labs ordered are listed, but only abnormal results are displayed) Labs Reviewed - No data to display  EKG  EKG Interpretation None       Radiology No results found.  Procedures Procedures (including critical care time)  Medications Ordered in ED Medications  ondansetron (ZOFRAN) 4 MG/2ML injection (not administered)     Initial Impression / Assessment and Plan / ED Course  I have reviewed the triage vital signs and the nursing notes.  Pertinent labs & imaging results that were available during my care of the patient were reviewed by me and considered in my medical decision making (see chart for details).  Clinical Course    Patient presented with complaints of elevated heart rate, severe anxiety after eating a marijuana brownie and smoking  marijuana. Patient sounds to have experienced a panic attack, but this has passed. Vital signs are now normal. Patient is more sleepy than anything. She has had some nausea and vomiting. This was treated with Zofran and IV fluids, now feeling better. Patient continues to do well here in the ER, was appropriate for discharge.  Final Clinical Impressions(s) / ED Diagnoses   Final diagnoses:  Marijuana use  Panic attack  Non-intractable vomiting with nausea, unspecified vomiting type    New Prescriptions New Prescriptions   No medications on file   I personally performed the services described in this documentation, which was scribed in my presence. The recorded information has been reviewed and is accurate.     Gilda Creasehristopher J Karlina Suares, MD 06/26/16 [phone]

## 2016-06-26 NOTE — ED Notes (Addendum)
Pt friend came and asked how long the wait was, when pt was given a possible estimate pt started screaming and stated "I cant wait that long I need to be seen now" pt leaned over vomited in the floor of the waiting room and then fell into her vomit and rolled around in it crying. Pt did not hit head, no injuries noted and no LOC.

## 2016-06-27 DIAGNOSIS — L409 Psoriasis, unspecified: Secondary | ICD-10-CM | POA: Diagnosis not present

## 2016-07-20 ENCOUNTER — Encounter: Payer: Self-pay | Admitting: Obstetrics

## 2016-07-20 ENCOUNTER — Ambulatory Visit (INDEPENDENT_AMBULATORY_CARE_PROVIDER_SITE_OTHER): Payer: BLUE CROSS/BLUE SHIELD | Admitting: Obstetrics

## 2016-07-20 VITALS — BP 114/72 | HR 80 | Temp 97.2°F | Wt 190.3 lb

## 2016-07-20 DIAGNOSIS — N898 Other specified noninflammatory disorders of vagina: Secondary | ICD-10-CM

## 2016-07-20 DIAGNOSIS — E6609 Other obesity due to excess calories: Secondary | ICD-10-CM

## 2016-07-20 MED ORDER — METRONIDAZOLE 500 MG PO TABS: 500.0000 mg | ORAL_TABLET | Freq: Two times a day (BID) | ORAL | 2 refills | Status: DC

## 2016-07-20 MED ORDER — PHENTERMINE HCL 37.5 MG PO CAPS: 37.5000 mg | ORAL_CAPSULE | ORAL | 2 refills | Status: DC

## 2016-07-20 NOTE — Progress Notes (Signed)
Patient ID: Amanda Peters, female   DOB: 10-15-1985, 30 y.o.   MRN: [id number]  Chief Complaint  Patient presents with  . other    Possible bacterial infection    HPI Amanda Peters is a 30 y.o. female.  Has malodorous vaginal discharge.  Denies vaginal itching or irritation. HPI  Past Medical History:  Diagnosis Date  . Psoriasis     Past Surgical History:  Procedure Laterality Date  . NO PAST SURGERIES      Family History  Problem Relation Age of Onset  . Hypothyroidism Mother   . Hypertension Father     Social History Social History  Substance Use Topics  . Smoking status: Current Every Day Smoker    Types: Cigarettes  . Smokeless tobacco: Never Used  . Alcohol use 0.0 oz/week     Comment: Ocassionally    No Known Allergies  Current Outpatient Prescriptions  Medication Sig Dispense Refill  . desonide (DESOWEN) 0.05 % cream     . EPIDUO FORTE 0.3-2.5 % GEL Apply 1 application topically at bedtime.    Marland Kitchen. ibuprofen (ADVIL,MOTRIN) 800 MG tablet Take 1 tablet (800 mg total) by mouth every 8 (eight) hours as needed for mild pain or moderate pain. 30 tablet 8  . SUMAtriptan (IMITREX) 100 MG tablet Take 1 tablet (100 mg total) by mouth every 2 (two) hours as needed for migraine. May repeat in 2 hours if headache persists or recurs. 10 tablet 5  . metroNIDAZOLE (FLAGYL) 500 MG tablet Take 1 tablet (500 mg total) by mouth 2 (two) times daily. 14 tablet 2  . phentermine 37.5 MG capsule Take 1 capsule (37.5 mg total) by mouth every morning. 30 capsule 2   No current facility-administered medications for this visit.     Review of Systems Review of Systems Constitutional: negative for fatigue and weight loss Respiratory: negative for cough and wheezing Cardiovascular: negative for chest pain, fatigue and palpitations Gastrointestinal: negative for abdominal pain and change in bowel habits Genitourinary:positive for malodorous vaginal discharge Integument/breast:  negative for nipple discharge Musculoskeletal:negative for myalgias Neurological: negative for gait problems and tremors Behavioral/Psych: negative for abusive relationship, depression Endocrine: negative for temperature intolerance      Blood pressure 114/72, pulse 80, temperature 97.2 F (36.2 C), temperature source Oral, weight 190 lb 4.8 oz (86.3 kg), last menstrual period 06/12/2016.  Physical Exam Physical Exam            General:  Alert and no distress Abdomen:  normal findings: no organomegaly, soft, non-tender and no hernia  Pelvis:  External genitalia: normal general appearance Urinary system: urethral meatus normal and bladder without fullness, nontender Vaginal: normal without tenderness, induration or masses.  Thin, grey malodorous discharge Cervix: normal appearance Adnexa: normal bimanual exam Uterus: anteverted and non-tender, normal size    50% of 15 min visit spent on counseling and coordination of care.    Data Reviewed Wet prep and cultures  Assessment     Malodorous vaginal discharge Obesity    Plan    Wet prep and cultures sent Educational material dispensed on BV Will await wet prep and culture results for treatment guidance Phentermine Rx along with educational material on obesity and weight management F/U prn  No orders of the defined types were placed in this encounter.  Meds ordered this encounter  Medications  . metroNIDAZOLE (FLAGYL) 500 MG tablet    Sig: Take 1 tablet (500 mg total) by mouth 2 (two) times daily.    Dispense:  14 tablet    Refill:  2  . phentermine 37.5 MG capsule    Sig: Take 1 capsule (37.5 mg total) by mouth every morning.    Dispense:  30 capsule    Refill:  2

## 2016-07-20 NOTE — Patient Instructions (Addendum)
Obesity, Adult Obesity is the condition of having too much total body fat. Being overweight or obese means that your weight is greater than what is considered healthy for your body size. Obesity is determined by a measurement called BMI. BMI is an estimate of body fat and is calculated from height and weight. For adults, a BMI of 30 or higher is considered obese. Obesity can eventually lead to other health concerns and major illnesses, including:  Stroke.  Coronary artery disease (CAD).  Type 2 diabetes.  Some types of cancer, including cancers of the colon, breast, uterus, and gallbladder.  Osteoarthritis.  High blood pressure (hypertension).  High cholesterol.  Sleep apnea.  Gallbladder stones.  Infertility problems. What are the causes? The main cause of obesity is taking in (consuming) more calories than your body uses for energy. Other factors that contribute to this condition may include:  Being born with genes that make you more likely to become obese.  Having a medical condition that causes obesity. These conditions include:  Hypothyroidism.  Polycystic ovarian syndrome (PCOS).  Binge-eating disorder.  Cushing syndrome.  Taking certain medicines, such as steroids, antidepressants, and seizure medicines.  Not being physically active (sedentary lifestyle).  Living where there are limited places to exercise safely or buy healthy foods.  Not getting enough sleep. What increases the risk? The following factors may increase your risk of this condition:  Having a family history of obesity.  Being a woman of African-American descent.  Being a man of Hispanic descent. What are the signs or symptoms? Having excessive body fat is the main symptom of this condition. How is this diagnosed? This condition may be diagnosed based on:  Your symptoms.  Your medical history.  A physical exam. Your health care provider may measure:  Your BMI. If you are an adult  with a BMI between 25 and less than 30, you are considered overweight. If you are an adult with a BMI of 30 or higher, you are considered obese.  The distances around your hips and your waist (circumferences). These may be compared to each other to help diagnose your condition.  Your skinfold thickness. Your health care provider may gently pinch a fold of your skin and measure it. How is this treated? Treatment for this condition often includes changing your lifestyle. Treatment may include some or all of the following:  Dietary changes. Work with your health care provider and a dietitian to set a weight-loss goal that is healthy and reasonable for you. Dietary changes may include eating:  Smaller portions. A portion size is the amount of a particular food that is healthy for you to eat at one time. This varies from person to person.  Low-calorie or low-fat options.  More whole grains, fruits, and vegetables.  Regular physical activity. This may include aerobic activity (cardio) and strength training.  Medicine to help you lose weight. Your health care provider may prescribe medicine if you are unable to lose 1 pound a week after 6 weeks of eating more healthily and doing more physical activity.  Surgery. Surgical options may include gastric banding and gastric bypass. Surgery may be done if:  Other treatments have not helped to improve your condition.  You have a BMI of 40 or higher.  You have life-threatening health problems related to obesity. Follow these instructions at home:   Eating and drinking  Follow recommendations from your health care provider about what you eat and drink. Your health care provider may advise you  to:  Limit fast foods, sweets, and processed snack foods.  Choose low-fat options, such as low-fat milk instead of whole milk.  Eat 5 or more servings of fruits or vegetables every day.  Eat at home more often. This gives you more control over what you  eat.  Choose healthy foods when you eat out.  Learn what a healthy portion size is.  Keep low-fat snacks on hand.  Avoid sugary drinks, such as soda, fruit juice, iced tea sweetened with sugar, and flavored milk.  Eat a healthy breakfast.  Drink enough water to keep your urine clear or pale yellow.  Do not go without eating for long periods of time (do not fast) or follow a fad diet. Fasting and fad diets can be unhealthy and even dangerous. Physical Activity  Exercise regularly, as told by your health care provider. Ask your health care provider what types of exercise are safe for you and how often you should exercise.  Warm up and stretch before being active.  Cool down and stretch after being active.  Rest between periods of activity. Lifestyle  Limit the time that you spend in front of your TV, computer, or video game system.  Find ways to reward yourself that do not involve food.  Limit alcohol intake to no more than 1 drink a day for nonpregnant women and 2 drinks a day for men. One drink equals 12 oz of beer, 5 oz of wine, or 1 oz of hard liquor. General instructions  Keep a weight loss journal to keep track of the food you eat and how much you exercise you get.  Take over-the-counter and prescription medicines only as told by your health care provider.  Take vitamins and supplements only as told by your health care provider.  Consider joining a support group. Your health care provider may be able to recommend a support group.  Keep all follow-up visits as told by your health care provider. This is important. Contact a health care provider if:  You are unable to meet your weight loss goal after 6 weeks of dietary and lifestyle changes. This information is not intended to replace advice given to you by your health care provider. Make sure you discuss any questions you have with your health care provider. Document Released: 09/15/2004 Document Revised: 01/11/2016  Document Reviewed: 05/27/2015 Elsevier Interactive Patient Education  2017 [employer] Inc[employer]  Bacterial Vaginosis Bacterial vaginosis is a vaginal infection that occurs when the normal balance of bacteria in the vagina is disrupted. It results from an overgrowth of certain bacteria. This is the most common vaginal infection among women ages 6815-44. Because bacterial vaginosis increases your risk for STIs (sexually transmitted infections), getting treated can help reduce your risk for chlamydia, gonorrhea, herpes, and HIV (human immunodeficiency virus). Treatment is also important for preventing complications in pregnant women, because this condition can cause an early (premature) delivery. What are the causes? This condition is caused by an increase in harmful bacteria that are normally present in small amounts in the vagina. However, the reason that the condition develops is not fully understood. What increases the risk? The following factors may make you more likely to develop this condition:  Having a new sexual partner or multiple sexual partners.  Having unprotected sex.  Douching.  Having an intrauterine device (IUD).  Smoking.  Drug and alcohol abuse.  Taking certain antibiotic medicines.  Being pregnant. You cannot get bacterial vaginosis from toilet seats, bedding, swimming pools, or contact with objects  around you. What are the signs or symptoms? Symptoms of this condition include:  Grey or white vaginal discharge. The discharge can also be watery or foamy.  A fish-like odor with discharge, especially after sexual intercourse or during menstruation.  Itching in and around the vagina.  Burning or pain with urination. Some women with bacterial vaginosis have no signs or symptoms. How is this diagnosed? This condition is diagnosed based on:  Your medical history.  A physical exam of the vagina.  Testing a sample of vaginal fluid under a microscope to look for a large  amount of bad bacteria or abnormal cells. Your health care provider may use a cotton swab or a small wooden spatula to collect the sample. How is this treated? This condition is treated with antibiotics. These may be given as a pill, a vaginal cream, or a medicine that is put into the vagina (suppository). If the condition comes back after treatment, a second round of antibiotics may be needed. Follow these instructions at home: Medicines  Take over-the-counter and prescription medicines only as told by your health care provider.  Take or use your antibiotic as told by your health care provider. Do not stop taking or using the antibiotic even if you start to feel better. General instructions  If you have a female sexual partner, tell her that you have a vaginal infection. She should see her health care provider and be treated if she has symptoms. If you have a female sexual partner, he does not need treatment.  During treatment:  Avoid sexual activity until you finish treatment.  Do not douche.  Avoid alcohol as directed by your health care provider.  Avoid breastfeeding as directed by your health care provider.  Drink enough water and fluids to keep your urine clear or pale yellow.  Keep the area around your vagina and rectum clean.  Wash the area daily with warm water.  Wipe yourself from front to back after using the toilet.  Keep all follow-up visits as told by your health care provider. This is important. How is this prevented?  Do not douche.  Wash the outside of your vagina with warm water only.  Use protection when having sex. This includes latex condoms and dental dams.  Limit how many sexual partners you have. To help prevent bacterial vaginosis, it is best to have sex with just one partner (monogamous).  Make sure you and your sexual partner are tested for STIs.  Wear cotton or cotton-lined underwear.  Avoid wearing tight pants and pantyhose, especially during  summer.  Limit the amount of alcohol that you drink.  Do not use any products that contain nicotine or tobacco, such as cigarettes and e-cigarettes. If you need help quitting, ask your health care provider.  Do not use illegal drugs. Where to find more information:  Centers for Disease Control and Prevention: SolutionApps.co.zawww.cdc.gov/std  American Sexual Health Association (ASHA): www.ashastd.org  U.S. Department of Health and Health and safety inspectorHuman Services, Office on Women's Health: ConventionalMedicines.siwww.womenshealth.gov/ or http://www.anderson-williamson.info/https://www.womenshealth.gov/a-z-topics/bacterial-vaginosis Contact a health care provider if:  Your symptoms do not improve, even after treatment.  You have more discharge or pain when urinating.  You have a fever.  You have pain in your abdomen.  You have pain during sex.  You have vaginal bleeding between periods. Summary  Bacterial vaginosis is a vaginal infection that occurs when the normal balance of bacteria in the vagina is disrupted.  Because bacterial vaginosis increases your risk for STIs (sexually transmitted infections), getting treated  can help reduce your risk for chlamydia, gonorrhea, herpes, and HIV (human immunodeficiency virus). Treatment is also important for preventing complications in pregnant women, because the condition can cause an early (premature) delivery.  This condition is treated with antibiotic medicines. These may be given as a pill, a vaginal cream, or a medicine that is put into the vagina (suppository). This information is not intended to replace advice given to you by your health care provider. Make sure you discuss any questions you have with your health care provider. Document Released: 08/08/2005 Document Revised: 04/23/2016 Document Reviewed: 04/23/2016 Elsevier Interactive Patient Education  2017 [employer].

## 2016-07-21 ENCOUNTER — Other Ambulatory Visit: Payer: Self-pay | Admitting: Obstetrics

## 2016-07-21 DIAGNOSIS — B373 Candidiasis of vulva and vagina: Secondary | ICD-10-CM

## 2016-07-21 DIAGNOSIS — B3731 Acute candidiasis of vulva and vagina: Secondary | ICD-10-CM

## 2016-07-23 LAB — NUSWAB BV AND CANDIDA, NAA
ATOPOBIUM VAGINAE: HIGH {score} — AB
CANDIDA GLABRATA, NAA: NEGATIVE
Candida albicans, NAA: NEGATIVE
Megasphaera 1: HIGH Score — AB

## 2016-07-26 ENCOUNTER — Other Ambulatory Visit: Payer: Self-pay | Admitting: Obstetrics

## 2016-08-19 ENCOUNTER — Encounter: Payer: Self-pay | Admitting: Obstetrics

## 2016-08-19 ENCOUNTER — Ambulatory Visit (INDEPENDENT_AMBULATORY_CARE_PROVIDER_SITE_OTHER): Payer: BLUE CROSS/BLUE SHIELD | Admitting: Obstetrics

## 2016-08-19 VITALS — BP 122/80 | HR 92 | Ht 62.25 in | Wt 191.0 lb

## 2016-08-19 DIAGNOSIS — N6320 Unspecified lump in the left breast, unspecified quadrant: Secondary | ICD-10-CM | POA: Diagnosis not present

## 2016-08-19 DIAGNOSIS — N6325 Unspecified lump in the left breast, overlapping quadrants: Secondary | ICD-10-CM

## 2016-08-19 NOTE — Progress Notes (Signed)
Pt discovered L breast mass around Christmas day. Pt reports mass is arpx. Half dollar size. Pt removed nipple piercing in August d/t pus from the nipple.

## 2016-08-19 NOTE — Progress Notes (Signed)
Patient ID: Amanda Peters, female   DOB: 09-27-1985, 30 y.o.   MRN: [id number]  Chief Complaint  Patient presents with  . Breast Mass    HPI Amanda Peters is a 30 y.o. female.  Felt breast lump ~ quarter size over the Christmas holiday.  Non tender, soft, and mobile. HPI  Past Medical History:  Diagnosis Date  . Psoriasis     Past Surgical History:  Procedure Laterality Date  . NO PAST SURGERIES      Family History  Problem Relation Age of Onset  . Hypothyroidism Mother   . Hypertension Father     Social History Social History  Substance Use Topics  . Smoking status: Current Every Day Smoker    Types: Cigarettes  . Smokeless tobacco: Never Used  . Alcohol use 0.0 oz/week     Comment: Ocassionally    No Known Allergies  Current Outpatient Prescriptions  Medication Sig Dispense Refill  . Adalimumab (HUMIRA PEN [place]) Inject into the skin.    Marland Kitchen. desonide (DESOWEN) 0.05 % cream     . EPIDUO FORTE 0.3-2.5 % GEL Apply 1 application topically at bedtime.    Marland Kitchen. ibuprofen (ADVIL,MOTRIN) 800 MG tablet Take 1 tablet (800 mg total) by mouth every 8 (eight) hours as needed for mild pain or moderate pain. 30 tablet 8  . phentermine 37.5 MG capsule Take 1 capsule (37.5 mg total) by mouth every morning. 30 capsule 2  . SUMAtriptan (IMITREX) 100 MG tablet Take 1 tablet (100 mg total) by mouth every 2 (two) hours as needed for migraine. May repeat in 2 hours if headache persists or recurs. 10 tablet 5  . fluconazole (DIFLUCAN) 150 MG tablet TAKE ONE TABLET BY MOUTH ONCE (Patient not taking: Reported on 08/19/2016) 1 tablet 2  . HUMIRA PEN 40 MG/0.8ML PNKT     . metroNIDAZOLE (FLAGYL) 500 MG tablet Take 1 tablet (500 mg total) by mouth 2 (two) times daily. (Patient not taking: Reported on 08/19/2016) 14 tablet 2   No current facility-administered medications for this visit.     Review of Systems Review of Systems Constitutional: negative for fatigue and weight loss Respiratory:  negative for cough and wheezing Cardiovascular: negative for chest pain, fatigue and palpitations Gastrointestinal: negative for abdominal pain and change in bowel habits Genitourinary:negative Integument/breast: positive for left breast mass Musculoskeletal:negative for myalgias Neurological: negative for gait problems and tremors Behavioral/Psych: negative for abusive relationship, depression Endocrine: negative for temperature intolerance      Blood pressure 122/80, pulse 92, height 5' 2.25" (1.581 m), weight 191 lb (86.6 kg), last menstrual period 08/18/2016.  Physical Exam Physical Exam General:   alert  Skin:   no rash or abnormalities  Lungs:   clear to auscultation bilaterally  Heart:   regular rate and rhythm, S1, S2 normal, no murmur, click, rub or gallop  Breasts:   mass at 12 o'clock periareolar, soft, mobile, non tender   50% of 15 min visit spent on counseling and coordination of care.    Data Reviewed none  Assessment     Left breast mass    Plan    Breast ultrasound ordered Follow up in 2 weeks.  Orders Placed This Encounter  Procedures  . US BREAST COMPLETE UNI LEFT INC AXILLA    [facility]  EPIC ORDER PF: NONE  CR/ENERTO   LT BREAST LUMP 12:00    Standing Status:   Future    Standing Expiration Date:   10/19/2017    Order Specific  Question:   Reason for Exam (SYMPTOM  OR DIAGNOSIS REQUIRED)    Answer:   breast lump    Order Specific Question:   Preferred imaging location?    Answer:   [id number]   Meds ordered this encounter  Medications  . Adalimumab (HUMIRA PEN [place])    Sig: Inject into the skin.  Marland Kitchen. HUMIRA PEN 40 MG/0.8ML PNKT

## 2016-08-23 ENCOUNTER — Ambulatory Visit (INDEPENDENT_AMBULATORY_CARE_PROVIDER_SITE_OTHER): Payer: BLUE CROSS/BLUE SHIELD | Admitting: Internal Medicine

## 2016-08-23 ENCOUNTER — Encounter: Payer: Self-pay | Admitting: Internal Medicine

## 2016-08-23 DIAGNOSIS — M25511 Pain in right shoulder: Secondary | ICD-10-CM

## 2016-08-23 DIAGNOSIS — M25519 Pain in unspecified shoulder: Secondary | ICD-10-CM | POA: Insufficient documentation

## 2016-08-23 MED ORDER — MELOXICAM 15 MG PO TABS: 15.0000 mg | ORAL_TABLET | Freq: Every day | ORAL | 0 refills | Status: DC

## 2016-08-23 NOTE — Assessment & Plan Note (Addendum)
Refer to Dr Katrinka BlazingSmith for further evaluation/treatment Deferred PT until she sees Dr Georga KaufmannSmith Start meloxicam 15 mg daily - take with food - stop if she has stomach upset Do not take advil while taking meloxicam ice

## 2016-08-23 NOTE — Progress Notes (Signed)
Subjective:    Patient ID: Amanda Peters, female    DOB: Jan 12, 1986, 31 y.o.   MRN: [id number]  HPI She is here for an acute visit.   Right shoulder pain:  It started a couple of months ago.  She denies injury or new acitivty.  She is unsure why it started.  She can not lift her shoulder all the way. It hurts from her right elbow to the top of her shoulder.  It hurts when she she lays on the shoulder.    She has taken ibuprofen for her menstraul cramps and has taken it since her shoulder pain started - she is unsure if it helped.  She has not tried anything different.  She feels it has not gotten better or worse.      Medications and allergies reviewed with patient and updated if appropriate.  Patient Active Problem List   Diagnosis Date Noted  . Migraines 01/21/2016  . Dermatitis 10/28/2013  . Obesity 10/28/2013    Current Outpatient Prescriptions on File Prior to Visit  Medication Sig Dispense Refill  . Adalimumab (HUMIRA PEN [place]) Inject into the skin.    Marland Kitchen desonide (DESOWEN) 0.05 % cream     . EPIDUO FORTE 0.3-2.5 % GEL Apply 1 application topically at bedtime.    Marland Kitchen HUMIRA PEN 40 MG/0.8ML PNKT     . ibuprofen (ADVIL,MOTRIN) 800 MG tablet Take 1 tablet (800 mg total) by mouth every 8 (eight) hours as needed for mild pain or moderate pain. 30 tablet 8  . phentermine 37.5 MG capsule Take 1 capsule (37.5 mg total) by mouth every morning. 30 capsule 2  . SUMAtriptan (IMITREX) 100 MG tablet Take 1 tablet (100 mg total) by mouth every 2 (two) hours as needed for migraine. May repeat in 2 hours if headache persists or recurs. 10 tablet 5   No current facility-administered medications on file prior to visit.     Past Medical History:  Diagnosis Date  . Psoriasis     Past Surgical History:  Procedure Laterality Date  . NO PAST SURGERIES      Social History   Social History  . Marital status: Single    Spouse name: N/A  . Number of children: N/A  . Years of education:  N/A   Social History Main Topics  . Smoking status: Current Every Day Smoker    Types: Cigarettes  . Smokeless tobacco: Never Used  . Alcohol use 0.0 oz/week     Comment: Ocassionally  . Drug use:     Types: Marijuana  . Sexual activity: Yes    Partners: Male    Birth control/ protection: , Pill   Other Topics Concern  . Not on file   Social History Narrative  . No narrative on file    Family History  Problem Relation Age of Onset  . Hypothyroidism Mother   . Hypertension Father     Review of Systems  Constitutional: Negative for fever.  Musculoskeletal: Positive for arthralgias. Negative for joint swelling and neck pain.  Neurological: Positive for weakness (arm and hand) and numbness (intermittent right arm).       Objective:   Vitals:   08/23/16 1616  BP: 110/74  Pulse: 80  Resp: 16  Temp: 98.3 F (36.8 C)   Filed Weights   08/23/16 1616  Weight: 191 lb (86.6 kg)   Body mass index is 34.65 kg/m.  Wt Readings from Last 3 Encounters:  08/23/16 191 lb (86.6  kg)  08/19/16 191 lb (86.6 kg)  07/20/16 190 lb 4.8 oz (86.3 kg)     Physical Exam A Right Shoulder exam was performed.   SWELLING: none  EFFUSION: no  WARMTH: no warmth  TENDERNESS: mild tenderness on throughout shoulder joint ; pain with movement of shoulder or elbow ROM: significantly dec ROM with pain NEUROLOGICAL EXAM: normal sensation and strength  PULSES: normal        Assessment & Plan:   See Problem List for Assessment and Plan of chronic medical problems.

## 2016-08-23 NOTE — Progress Notes (Signed)
Pre visit review using our clinic review tool, if applicable. No additional management support is needed unless otherwise documented below in the visit note. 

## 2016-08-23 NOTE — Patient Instructions (Addendum)
  Medications reviewed and updated.  Changes include taking meloxicam 15 mg daily with food for the pain.  Do not take any ibuprofen when taking the meloxicam - take one of the other.   Stop the medication if you have stomach upset.    Your prescription(s) have been submitted to your pharmacy. Please take as directed and contact our office if you believe you are having problem(s) with the medication(s).  A referral was ordered for for Dr Katrinka BlazingSmith

## 2016-08-30 ENCOUNTER — Ambulatory Visit
Admission: RE | Admit: 2016-08-30 | Discharge: 2016-08-30 | Disposition: A | Discharge status: Home / Self Care | Payer: BLUE CROSS/BLUE SHIELD | Source: Ambulatory Visit | Attending: Obstetrics | Admitting: Obstetrics

## 2016-08-30 DIAGNOSIS — N6452 Nipple discharge: Secondary | ICD-10-CM | POA: Diagnosis not present

## 2016-08-30 DIAGNOSIS — N6320 Unspecified lump in the left breast, unspecified quadrant: Principal | ICD-10-CM

## 2016-08-30 DIAGNOSIS — N6325 Unspecified lump in the left breast, overlapping quadrants: Secondary | ICD-10-CM

## 2016-08-31 NOTE — Progress Notes (Deleted)
Amanda Peters [address] [place], [place] [address] Phone: [phone] Subjective:    I'm seeing this patient by the request  of:  Pincus Sanes, MD   CC: Right shoulder pain  [id number]  Amanda Peters is a 31 y.o. female coming in with complaint of right shoulder pain. Past medical history significant for psoriasis. Patient states his been going on for a couple months. Does not remember any injury injury. States that she notices that she is unable to lift her arm completely. States that it hurts all way from the elbow to the shoulder. Also pain when she lays on the shoulder itself. Has taken ibuprofen states only very mild benefit. Denies any numbness. States that the pain keeps her from lifting but wouldn't denies any weakness. Rates the severity pain is 8 out of 10.     Past Medical History:  Diagnosis Date  . Psoriasis    Past Surgical History:  Procedure Laterality Date  . NO PAST SURGERIES     Social History   Social History  . Marital status: Single    Spouse name: N/A  . Number of children: N/A  . Years of education: N/A   Social History Main Topics  . Smoking status: Current Every Day Smoker    Types: Cigarettes  . Smokeless tobacco: Never Used  . Alcohol use 0.0 oz/week     Comment: Ocassionally  . Drug use:     Types: Marijuana  . Sexual activity: Yes    Partners: Male    Birth control/ protection: , Pill   Other Topics Concern  . Not on file   Social History Narrative  . No narrative on file   No Known Allergies Family History  Problem Relation Age of Onset  . Hypothyroidism Mother   . Hypertension Father     Past medical history, social, surgical and family history all reviewed in electronic medical record.  No pertanent information unless stated regarding to the chief complaint.   Review of Systems:Review of systems updated and as accurate as of 08/31/16  No headache, visual changes, nausea, vomiting,  diarrhea, constipation, dizziness, abdominal pain, skin rash, fevers, chills, night sweats, weight loss, swollen lymph nodes, body aches, joint swelling, muscle aches, chest pain, shortness of breath, mood changes.   Objective  Last menstrual period 08/18/2016. Systems examined below as of 08/31/16   General: No apparent distress alert and oriented x3 mood and affect normal, dressed appropriately.  HEENT: Pupils equal, extraocular movements intact  Respiratory: Patient's speak in full sentences and does not appear short of breath  Cardiovascular: No lower extremity edema, non tender, no erythema  Skin: Warm dry intact with no signs of infection or rash on extremities or on axial skeleton.  Abdomen: Soft nontender  Neuro: Cranial nerves II through XII are intact, neurovascularly intact in all extremities with 2+ DTRs and 2+ pulses.  Lymph: No lymphadenopathy of posterior or anterior cervical chain or axillae bilaterally.  Gait normal with good balance and coordination.  MSK:  Non tender with full range of motion and good stability and symmetric strength and tone of elbows, wrist, hip, knee and ankles bilaterally.  Shoulder: Right Inspection reveals no abnormalities, atrophy or asymmetry. Palpation is normal with no tenderness over AC joint or bicipital groove. ROM is full in all planes passively. Rotator cuff strength normal throughout. signs of impingement with positive Neer and Hawkin's tests, but negative empty can sign. Speeds and Yergason's tests normal. No  labral pathology noted with negative Obrien's, negative clunk and good stability. Normal scapular function observed. No painful arc and no drop arm sign. No apprehension sign  MSK US performed of: Right This study was ordered, performed, and interpreted by Amanda Peters.  Shoulder:   Supraspinatus:  Appears normal on long and transverse views, Bursal bulge seen with shoulder abduction on impingement view. Infraspinatus:   Appears normal on long and transverse views. Significant increase in Doppler flow Subscapularis:  Appears normal on long and transverse views. Positive bursa Teres Minor:  Appears normal on long and transverse views. AC joint:  Capsule undistended, no geyser sign. Glenohumeral Joint:  Appears normal without effusion. Glenoid Labrum:  Intact without visualized tears. Biceps Tendon:  Appears normal on long and transverse views, no fraying of tendon, tendon located in intertubercular groove, no subluxation with shoulder internal or external rotation.  Impression: Subacromial bursitis  Procedure: Real-time Ultrasound Guided Injection of right glenohumeral joint Device: GE Logiq E  Ultrasound guided injection is preferred based studies that show increased duration, increased effect, greater accuracy, decreased procedural pain, increased response rate with ultrasound guided versus blind injection.  Verbal informed consent obtained.  Time-out conducted.  Noted no overlying erythema, induration, or other signs of local infection.  Skin prepped in a sterile fashion.  Local anesthesia: Topical Ethyl chloride.  With sterile technique and under real time ultrasound guidance:  Joint visualized.  23g 1  inch needle inserted posterior approach. Pictures taken for needle placement. Patient did have injection of 2 cc of 1% lidocaine, 2 cc of 0.5% Marcaine, and 1.0 cc of Kenalog 40 mg/dL. Completed without difficulty  Pain immediately resolved suggesting accurate placement of the medication.  Advised to call if fevers/chills, erythema, induration, drainage, or persistent bleeding.  Images permanently stored and available for review in the ultrasound unit.  Impression: Technically successful ultrasound guided injection.    Impression and Recommendations:     This case required medical decision making of moderate complexity.      Note: This dictation was prepared with Dragon dictation along with  smaller phrase technology. Any transcriptional errors that result from this process are unintentional.

## 2016-09-01 ENCOUNTER — Ambulatory Visit: Payer: BLUE CROSS/BLUE SHIELD | Admitting: Family Medicine

## 2016-09-05 ENCOUNTER — Ambulatory Visit: Payer: BLUE CROSS/BLUE SHIELD | Admitting: Obstetrics

## 2016-09-06 ENCOUNTER — Ambulatory Visit: Payer: BLUE CROSS/BLUE SHIELD | Admitting: Internal Medicine

## 2016-09-12 DIAGNOSIS — N6002 Solitary cyst of left breast: Secondary | ICD-10-CM | POA: Diagnosis not present

## 2016-09-13 ENCOUNTER — Encounter: Payer: Self-pay | Admitting: *Deleted

## 2016-09-16 ENCOUNTER — Other Ambulatory Visit: Payer: Self-pay | Admitting: General Surgery

## 2016-09-16 DIAGNOSIS — N6002 Solitary cyst of left breast: Secondary | ICD-10-CM

## 2016-09-20 ENCOUNTER — Ambulatory Visit
Admission: RE | Admit: 2016-09-20 | Discharge: 2016-09-20 | Disposition: A | Discharge status: Home / Self Care | Payer: BLUE CROSS/BLUE SHIELD | Source: Ambulatory Visit | Attending: General Surgery | Admitting: General Surgery

## 2016-09-20 ENCOUNTER — Other Ambulatory Visit: Payer: Self-pay | Admitting: General Surgery

## 2016-09-20 DIAGNOSIS — N6320 Unspecified lump in the left breast, unspecified quadrant: Secondary | ICD-10-CM

## 2016-09-20 DIAGNOSIS — N6322 Unspecified lump in the left breast, upper inner quadrant: Secondary | ICD-10-CM | POA: Diagnosis not present

## 2016-09-20 DIAGNOSIS — N611 Abscess of the breast and nipple: Secondary | ICD-10-CM | POA: Diagnosis not present

## 2016-09-20 DIAGNOSIS — N6002 Solitary cyst of left breast: Secondary | ICD-10-CM

## 2016-09-26 DIAGNOSIS — L409 Psoriasis, unspecified: Secondary | ICD-10-CM | POA: Diagnosis not present

## 2016-09-27 LAB — AEROBIC/ANAEROBIC CULTURE (SURGICAL/DEEP WOUND)

## 2016-09-27 LAB — AEROBIC/ANAEROBIC CULTURE W GRAM STAIN (SURGICAL/DEEP WOUND)

## 2016-10-14 ENCOUNTER — Ambulatory Visit: Payer: Self-pay | Admitting: General Surgery

## 2016-10-14 ENCOUNTER — Encounter (HOSPITAL_COMMUNITY): Payer: Self-pay | Admitting: *Deleted

## 2016-10-14 DIAGNOSIS — N611 Abscess of the breast and nipple: Secondary | ICD-10-CM | POA: Diagnosis not present

## 2016-10-14 NOTE — Progress Notes (Signed)
Pt denies SOB, chest pain, and being under the care of a cardiologist. Pt denies having a stress test ,echo and cardiac cath. Pt denies having an EKG and chest x ray within the last year. Pt denies having recent labs. Pt stated that her last dose of Phentermine was " a week or two ago."  Pt made aware to stop taking Aspirin, vitamins, fish oil, Phentermine and herbal medications. Do not take any NSAIDs ie: Ibuprofen, Advil, Naproxen, BC and Goody Powder or any medication containing Aspirin. Pt verbalized understanding of all pre-op instructions.

## 2016-10-17 ENCOUNTER — Ambulatory Visit (HOSPITAL_COMMUNITY): Payer: BLUE CROSS/BLUE SHIELD | Admitting: Anesthesiology

## 2016-10-17 ENCOUNTER — Encounter (HOSPITAL_COMMUNITY): Payer: Self-pay | Admitting: *Deleted

## 2016-10-17 ENCOUNTER — Encounter (HOSPITAL_COMMUNITY)
Admission: RE | Disposition: A | Discharge status: Home / Self Care | Payer: Self-pay | Source: Ambulatory Visit | Attending: General Surgery

## 2016-10-17 ENCOUNTER — Ambulatory Visit (HOSPITAL_COMMUNITY)
Admission: RE | Admit: 2016-10-17 | Discharge: 2016-10-17 | Disposition: A | Discharge status: Home / Self Care | Payer: BLUE CROSS/BLUE SHIELD | Source: Ambulatory Visit | Attending: General Surgery | Admitting: General Surgery

## 2016-10-17 DIAGNOSIS — Z79899 Other long term (current) drug therapy: Secondary | ICD-10-CM | POA: Diagnosis not present

## 2016-10-17 DIAGNOSIS — N6320 Unspecified lump in the left breast, unspecified quadrant: Secondary | ICD-10-CM | POA: Diagnosis present

## 2016-10-17 DIAGNOSIS — N611 Abscess of the breast and nipple: Secondary | ICD-10-CM | POA: Insufficient documentation

## 2016-10-17 DIAGNOSIS — Z87891 Personal history of nicotine dependence: Secondary | ICD-10-CM | POA: Insufficient documentation

## 2016-10-17 HISTORY — DX: Headache: R51

## 2016-10-17 HISTORY — DX: Abscess of the breast and nipple: N61.1

## 2016-10-17 HISTORY — PX: INCISION AND DRAINAGE ABSCESS: SHX5864

## 2016-10-17 HISTORY — DX: Headache, unspecified: R51.9

## 2016-10-17 LAB — HCG, SERUM, QUALITATIVE: Preg, Serum: POSITIVE — AB

## 2016-10-17 SURGERY — INCISION AND DRAINAGE, ABSCESS
Anesthesia: Monitor Anesthesia Care | Site: Breast | Laterality: Left

## 2016-10-17 MED ORDER — CHLORHEXIDINE GLUCONATE CLOTH 2 % EX PADS: 6.0000 | MEDICATED_PAD | Freq: Once | CUTANEOUS | Status: DC

## 2016-10-17 MED ORDER — ONDANSETRON HCL 4 MG/2ML IJ SOLN
INTRAMUSCULAR | Status: AC
  Filled 2016-10-17: qty 2

## 2016-10-17 MED ORDER — HYDROCODONE-ACETAMINOPHEN 5-325 MG PO TABS: 1.0000 | ORAL_TABLET | ORAL | 0 refills | Status: DC | PRN

## 2016-10-17 MED ORDER — FENTANYL CITRATE (PF) 100 MCG/2ML IJ SOLN
INTRAMUSCULAR | Status: AC
  Filled 2016-10-17: qty 4

## 2016-10-17 MED ORDER — GABAPENTIN 300 MG PO CAPS
300.0000 mg | ORAL_CAPSULE | ORAL | Status: AC
  Administered 2016-10-17: 300 mg via ORAL
  Filled 2016-10-17: qty 1

## 2016-10-17 MED ORDER — PROPOFOL 10 MG/ML IV BOLUS
INTRAVENOUS | Status: AC
  Filled 2016-10-17: qty 20

## 2016-10-17 MED ORDER — FENTANYL CITRATE (PF) 100 MCG/2ML IJ SOLN
INTRAMUSCULAR | Status: AC
  Filled 2016-10-17: qty 2

## 2016-10-17 MED ORDER — LIDOCAINE 2% (20 MG/ML) 5 ML SYRINGE
INTRAMUSCULAR | Status: AC
  Filled 2016-10-17: qty 5

## 2016-10-17 MED ORDER — LIDOCAINE-EPINEPHRINE (PF) 1 %-1:200000 IJ SOLN
INTRAMUSCULAR | Status: AC
  Filled 2016-10-17: qty 30

## 2016-10-17 MED ORDER — FENTANYL CITRATE (PF) 100 MCG/2ML IJ SOLN
INTRAMUSCULAR | Status: AC
Start: 2016-10-17 — End: ?
  Filled 2016-10-17: qty 2

## 2016-10-17 MED ORDER — EPHEDRINE 5 MG/ML INJ
INTRAVENOUS | Status: AC
  Filled 2016-10-17: qty 10

## 2016-10-17 MED ORDER — MIDAZOLAM HCL 2 MG/2ML IJ SOLN
INTRAMUSCULAR | Status: AC
  Filled 2016-10-17: qty 2

## 2016-10-17 MED ORDER — ACETAMINOPHEN 500 MG PO TABS
1000.0000 mg | ORAL_TABLET | ORAL | Status: AC
  Administered 2016-10-17: 1000 mg via ORAL
  Filled 2016-10-17: qty 2

## 2016-10-17 MED ORDER — LIDOCAINE-EPINEPHRINE (PF) 1 %-1:200000 IJ SOLN
INTRAMUSCULAR | Status: DC | PRN
  Administered 2016-10-17: 18 mL

## 2016-10-17 MED ORDER — 0.9 % SODIUM CHLORIDE (POUR BTL) OPTIME
TOPICAL | Status: DC | PRN
  Administered 2016-10-17: 1000 mL

## 2016-10-17 MED ORDER — LACTATED RINGERS IV SOLN: INTRAVENOUS | Status: DC

## 2016-10-17 MED ORDER — LACTATED RINGERS IV SOLN
INTRAVENOUS | Status: DC
  Administered 2016-10-17: 14:00:00 via INTRAVENOUS

## 2016-10-17 MED ORDER — PHENYLEPHRINE 40 MCG/ML (10ML) SYRINGE FOR IV PUSH (FOR BLOOD PRESSURE SUPPORT)
PREFILLED_SYRINGE | INTRAVENOUS | Status: AC
  Filled 2016-10-17: qty 10

## 2016-10-17 MED ORDER — CEPHALEXIN 500 MG PO CAPS: 500.0000 mg | ORAL_CAPSULE | Freq: Four times a day (QID) | ORAL | 0 refills | Status: DC

## 2016-10-17 MED ORDER — CELECOXIB 200 MG PO CAPS
400.0000 mg | ORAL_CAPSULE | ORAL | Status: AC
  Administered 2016-10-17: 400 mg via ORAL
  Filled 2016-10-17: qty 2

## 2016-10-17 MED ORDER — CEFAZOLIN SODIUM-DEXTROSE 2-4 GM/100ML-% IV SOLN
2.0000 g | INTRAVENOUS | Status: AC
  Administered 2016-10-17: 2 g via INTRAVENOUS
  Filled 2016-10-17: qty 100

## 2016-10-17 MED ORDER — LACTATED RINGERS IV SOLN
INTRAVENOUS | Status: DC | PRN
  Administered 2016-10-17: 16:00:00 via INTRAVENOUS

## 2016-10-17 SURGICAL SUPPLY — 30 items
BNDG GAUZE ELAST 4 BULKY (GAUZE/BANDAGES/DRESSINGS) IMPLANT
CANISTER SUCT 3000ML PPV (MISCELLANEOUS) ×2 IMPLANT
COVER SURGICAL LIGHT HANDLE (MISCELLANEOUS) ×2 IMPLANT
DRAIN CHANNEL 19F RND (DRAIN) ×2 IMPLANT
DRAPE CHEST BREAST 15X10 FENES (DRAPES) ×2 IMPLANT
DRAPE LAPAROSCOPIC ABDOMINAL (DRAPES) IMPLANT
DRAPE LAPAROTOMY 100X72 PEDS (DRAPES) IMPLANT
DRSG PAD ABDOMINAL 8X10 ST (GAUZE/BANDAGES/DRESSINGS) IMPLANT
ELECT CAUTERY BLADE 6.4 (BLADE) ×2 IMPLANT
ELECT REM PT RETURN 9FT ADLT (ELECTROSURGICAL) ×2
ELECTRODE REM PT RTRN 9FT ADLT (ELECTROSURGICAL) ×1 IMPLANT
EVACUATOR SILICONE 100CC (DRAIN) ×2 IMPLANT
GAUZE SPONGE 4X4 12PLY STRL (GAUZE/BANDAGES/DRESSINGS) IMPLANT
GLOVE BIO SURGEON STRL SZ7.5 (GLOVE) ×2 IMPLANT
GOWN STRL REUS W/ TWL LRG LVL3 (GOWN DISPOSABLE) ×2 IMPLANT
GOWN STRL REUS W/TWL LRG LVL3 (GOWN DISPOSABLE) ×2
KIT BASIN OR (CUSTOM PROCEDURE TRAY) ×2 IMPLANT
KIT ROOM TURNOVER OR (KITS) ×2 IMPLANT
NEEDLE HYPO 25GX1X1/2 BEV (NEEDLE) ×2 IMPLANT
NS IRRIG 1000ML POUR BTL (IV SOLUTION) ×2 IMPLANT
PACK GENERAL/GYN (CUSTOM PROCEDURE TRAY) ×2 IMPLANT
PAD ARMBOARD 7.5X6 YLW CONV (MISCELLANEOUS) IMPLANT
SPONGE GAUZE 4X4 12PLY STER LF (GAUZE/BANDAGES/DRESSINGS) ×2 IMPLANT
SUT ETHILON 2 0 FS 18 (SUTURE) ×2 IMPLANT
SWAB COLLECTION DEVICE MRSA (MISCELLANEOUS) ×2 IMPLANT
SYR CONTROL 10ML LL (SYRINGE) ×4 IMPLANT
TAPE CLOTH SURG 4X10 WHT LF (GAUZE/BANDAGES/DRESSINGS) ×2 IMPLANT
TOWEL OR 17X24 6PK STRL BLUE (TOWEL DISPOSABLE) ×2 IMPLANT
TOWEL OR 17X26 10 PK STRL BLUE (TOWEL DISPOSABLE) IMPLANT
TUBE ANAEROBIC SPECIMEN COL (MISCELLANEOUS) ×2 IMPLANT

## 2016-10-17 NOTE — Anesthesia Preprocedure Evaluation (Addendum)
Anesthesia Evaluation  Patient identified by MRN, date of birth, ID band Patient awake    Reviewed: Allergy & Precautions, NPO status , Patient's Chart, lab work & pertinent test results  Airway Mallampati: II  TM Distance: >3 FB Neck ROM: Full    Dental no notable dental hx.    Pulmonary neg pulmonary ROS, former smoker,    Pulmonary exam normal breath sounds clear to auscultation       Cardiovascular negative cardio ROS Normal cardiovascular exam Rhythm:Regular Rate:Normal     Neuro/Psych negative neurological ROS  negative psych ROS   GI/Hepatic negative GI ROS, Neg liver ROS,   Endo/Other  negative endocrine ROS  Renal/GU negative Renal ROS  negative genitourinary   Musculoskeletal negative musculoskeletal ROS (+)   Abdominal   Peds negative pediatric ROS (+)  Hematology negative hematology ROS (+)   Anesthesia Other Findings   Reproductive/Obstetrics (+) Pregnancy Pegnancy test positive                            Anesthesia Physical Anesthesia Plan  ASA: I  Anesthesia Plan: MAC   Post-op Pain Management:    Induction: Intravenous  Airway Management Planned:   Additional Equipment:   Intra-op Plan:   Post-operative Plan:   Informed Consent: I have reviewed the patients History and Physical, chart, labs and discussed the procedure including the risks, benefits and alternatives for the proposed anesthesia with the patient or authorized representative who has indicated his/her understanding and acceptance.   Dental advisory given  Plan Discussed with: CRNA  Anesthesia Plan Comments: (Long discussion with patient about risk of general anesthesia in the first trimester. Dr. Marlou Starks willing to try under straight local anesthesia.)       Anesthesia Quick Evaluation

## 2016-10-17 NOTE — Op Note (Signed)
10/17/2016  5:03 PM  PATIENT:  Amanda Peters  31 y.o. female  PRE-OPERATIVE DIAGNOSIS:  LEFT BREAST ABSCESS  POST-OPERATIVE DIAGNOSIS:  LEFT BREAST ABSCESS  PROCEDURE:  Procedure(s): INCISION AND DRAINAGE LEFT BREAST ABSCESS (Left)  SURGEON:  Surgeon(s) and Role:    * Griselda MinerPaul Toth III, MD - Primary  PHYSICIAN ASSISTANT:   ASSISTANTS: none   ANESTHESIA:   local  EBL:  Total I/O In: 100 [I.V.:100] Out: -   BLOOD ADMINISTERED:none  DRAINS: (1) Jackson-Pratt drain(s) with closed bulb suction in the breast abscess   LOCAL MEDICATIONS USED:  LIDOCAINE   SPECIMEN:  No Specimen  DISPOSITION OF SPECIMEN:  N/A  COUNTS:  YES  TOURNIQUET:  * No tourniquets in log *  DICTATION: .Dragon Dictation   After informed consent was obtained the patient was brought to the operating room and placed in the supine position on the operating room table. The left breast was then prepped with ChloraPrep, allowed to dry, and draped in usual sterile manner. An appropriate timeout was performed. The lateral left breast was then infiltrated with 1% lidocaine. A small incision was made on the lateral left breast with a 15 blade knife. The incision was carried through the skin and subcutaneous tissue sharply with electrocautery. Blunt dissection was carried out with a tonsil clamp until the abscess cavity was entered. A large amount of pus was evacuated. Cultures were obtained. A 19 French round Blake drain was placed in the abscess cavity and the drain was anchored to the skin with a 2-0 nylon stitch. The drain was placed to bulb suction. Sterile dressings were applied. Patient tolerated the procedure well. At the end of the case all needle sponge and instrument counts were correct. The patient was then taken to recovery in stable condition.  PLAN OF CARE: Discharge to home after PACU  PATIENT DISPOSITION:  PACU - hemodynamically stable.   Delay start of Pharmacological VTE agent (>24hrs) due to surgical  blood loss or risk of bleeding: not applicable

## 2016-10-17 NOTE — H&P (Signed)
Amanda Peters  Location: [facility] Patient #: [id number] DOB: 07-14-1986 Single / Language: Cleophus Molt / Race: Black or African American Female   History of Present Illness  The patient is a 31 year old female who presents for a follow-up for Breast abscess. The patient is a 31 year old black female who we saw recently with a cyst behind the left nipple. Since her last visit the cyst was aspirated. It grew out multiple bacteria. Since the aspiration the palpable fullness centrally in the left breast has gotten much larger. She is experiencing significant pain in the left breast. She denies any fevers or chills.   Allergies  No Known Drug Allergies  Medication History Ibuprofen (800MG Tablet, Oral as needed) Active. SUMAtriptan Succinate (100MG Tablet, Oral) Active. MetroNIDAZOLE (500MG Tablet, Oral) Active. Humira Pen-Psoriasis Starter (40MG/0.8ML Pen-inj Kit, Subcutaneous) Active. Fluconazole (150MG Tablet, Oral) Active. Medications Reconciled    Review of Systems General Not Present- Appetite Loss, Chills, Fatigue, Fever, Night Sweats, Weight Gain and Weight Loss. Skin Present- Rash. Not Present- Change in Wart/Mole, Dryness, Hives, Jaundice, New Lesions, Non-Healing Wounds and Ulcer. HEENT Present- Wears glasses/contact lenses. Not Present- Earache, Hearing Loss, Hoarseness, Nose Bleed, Oral Ulcers, Ringing in the Ears, Seasonal Allergies, Sinus Pain, Sore Throat, Visual Disturbances and Yellow Eyes. Respiratory Present- Snoring. Not Present- Bloody sputum, Chronic Cough, Difficulty Breathing and Wheezing. Breast Present- Breast Mass, Breast Pain, Nipple Discharge and Skin Changes. Cardiovascular Not Present- Chest Pain, Difficulty Breathing Lying Down, Leg Cramps, Palpitations, Rapid Heart Rate, Shortness of Breath and Swelling of Extremities. Gastrointestinal Not Present- Abdominal Pain, Bloating, Bloody Stool, Change in Bowel Habits, Chronic diarrhea,  Constipation, Difficulty Swallowing, Excessive gas, Gets full quickly at meals, Hemorrhoids, Indigestion, Nausea, Rectal Pain and Vomiting. Female Genitourinary Not Present- Frequency, Nocturia, Painful Urination, Pelvic Pain and Urgency. Musculoskeletal Present- Joint Pain and Joint Stiffness. Not Present- Back Pain, Muscle Pain, Muscle Weakness and Swelling of Extremities. Neurological Present- Headaches. Not Present- Decreased Memory, Fainting, Numbness, Seizures, Tingling, Tremor, Trouble walking and Weakness. Psychiatric Not Present- Anxiety, Bipolar, Change in Sleep Pattern, Depression, Fearful and Frequent crying. Endocrine Present- Cold Intolerance. Not Present- Excessive Hunger, Hair Changes, Heat Intolerance, Hot flashes and New Diabetes. Hematology Not Present- Blood Thinners, Easy Bruising, Excessive bleeding, Gland problems, HIV and Persistent Infections.  Vitals  Weight: 186.8 lb Height: 62in Body Surface Area: 1.86 m Body Mass Index: 34.17 kg/m  Temp.: 98.66F  BP: 122/82 (Sitting, Left Arm, Standard)       Physical Exam General Mental Status-Alert. General Appearance-Consistent with stated age. Hydration-Well hydrated. Voice-Normal.  Head and Neck Head-normocephalic, atraumatic with no lesions or palpable masses. Trachea-midline. Thyroid Gland Characteristics - normal size and consistency.  Eye Eyeball - Bilateral-Extraocular movements intact. Sclera/Conjunctiva - Bilateral-No scleral icterus.  Chest and Lung Exam Chest and lung exam reveals -quiet, even and easy respiratory effort with no use of accessory muscles and on auscultation, normal breath sounds, no adventitious sounds and normal vocal resonance. Inspection Chest Wall - Normal. Back - normal.  Breast Note: There is a large palpable tender mass centrally in the left breast. There is some slight redness associated with it.   Cardiovascular Cardiovascular examination  reveals -normal heart sounds, regular rate and rhythm with no murmurs and normal pedal pulses bilaterally.  Abdomen Inspection Inspection of the abdomen reveals - No Hernias. Skin - Scar - no surgical scars. Palpation/Percussion Palpation and Percussion of the abdomen reveal - Soft, Non Tender, No Rebound tenderness, No Rigidity (guarding) and No hepatosplenomegaly. Auscultation Auscultation  of the abdomen reveals - Bowel sounds normal.  Neurologic Neurologic evaluation reveals -alert and oriented x 3 with no impairment of recent or remote memory. Mental Status-Normal.  Musculoskeletal Normal Exam - Left-Upper Extremity Strength Normal and Lower Extremity Strength Normal. Normal Exam - Right-Upper Extremity Strength Normal and Lower Extremity Strength Normal.  Lymphatic Head & Neck  General Head & Neck Lymphatics: Bilateral - Description - Normal. Axillary  General Axillary Region: Bilateral - Description - Normal. Tenderness - Non Tender. Femoral & Inguinal  Generalized Femoral & Inguinal Lymphatics: Bilateral - Description - Normal. Tenderness - Non Tender.    Assessment & Plan  LEFT BREAST ABSCESS (N61.1) Impression: The patient appears to have an enlarging left breast abscess. Because it seems to be progressing and I would recommend that this area be incised and drained. I have discussed with her in detail the risks and benefits of the operation and due to this as well as some of the technical aspects and she understands and wishes to proceed. We will plan to do this on Monday in the operating room.

## 2016-10-17 NOTE — Anesthesia Procedure Notes (Signed)
Procedure Name: MAC Date/Time: 10/17/2016 4:31 PM Performed by: Eligha Bridegroom Pre-anesthesia Checklist: Patient identified, Emergency Drugs available, Suction available, Patient being monitored and Timeout performed Patient Re-evaluated:Patient Re-evaluated prior to induction

## 2016-10-17 NOTE — Interval H&P Note (Signed)
History and Physical Interval Note:  10/17/2016 2:15 PM  Amanda Peters  has presented today for surgery, with the diagnosis of LEFT BREAST ABSCESS  The various methods of treatment have been discussed with the patient and family. After consideration of risks, benefits and other options for treatment, the patient has consented to  Procedure(s): INCISION AND DRAINAGE LEFT BREAST ABSCESS (Left) as a surgical intervention .  The patient's history has been reviewed, patient examined, no change in status, stable for surgery.  I have reviewed the patient's chart and labs.  Questions were answered to the patient's satisfaction.     TOTH III,Arriel Victor S

## 2016-10-17 NOTE — Transfer of Care (Signed)
Immediate Anesthesia Transfer of Care Note  Patient: Frann Rider  Procedure(s) Performed: Procedure(s): INCISION AND DRAINAGE LEFT BREAST ABSCESS (Left)  Patient Location: PACU  Anesthesia Type:MAC  Level of Consciousness: awake, alert  and oriented  Airway & Oxygen Therapy: Patient Spontanous Breathing  Post-op Assessment: Report given to RN and Post -op Vital signs reviewed and stable  Post vital signs: Reviewed and stable  Last Vitals:  Vitals:   10/17/16 1421 10/17/16 1708  BP:  121/73  Pulse: 92 76  Resp:  20  Temp: 36.9 C 36.4 C    Last Pain:  Vitals:   10/17/16 1421  TempSrc: Oral      Patients Stated Pain Goal: 3 (72/53/66 [id number])  Complications: No apparent anesthesia complications

## 2016-10-17 NOTE — Interval H&P Note (Signed)
History and Physical Interval Note:  10/17/2016 3:46 PM  Amanda Peters  has presented today for surgery, with the diagnosis of LEFT BREAST ABSCESS  The various methods of treatment have been discussed with the patient and family. After consideration of risks, benefits and other options for treatment, the patient has consented to  Procedure(s): INCISION AND DRAINAGE LEFT BREAST ABSCESS (Left) as a surgical intervention .  The patient's history has been reviewed, patient examined, no change in status, stable for surgery.  I have reviewed the patient's chart and labs.  Questions were answered to the patient's satisfaction.     TOTH III,Laiden Milles S

## 2016-10-17 NOTE — Progress Notes (Signed)
Patient's mother and boyfriend at bedside for discharge teaching including teaching regarding care of JP drain.  They were able to observe proper emptying of drain and verbalize understanding for performing drain care at home.

## 2016-10-18 ENCOUNTER — Encounter (HOSPITAL_COMMUNITY): Payer: Self-pay | Admitting: General Surgery

## 2016-10-18 NOTE — Anesthesia Postprocedure Evaluation (Signed)
Anesthesia Post Note  Patient: Frann Rider  Procedure(s) Performed: Procedure(s) (LRB): INCISION AND DRAINAGE LEFT BREAST ABSCESS (Left)  Patient location during evaluation: PACU Anesthesia Type: MAC Level of consciousness: awake and alert Pain management: pain level controlled Vital Signs Assessment: post-procedure vital signs reviewed and stable Respiratory status: spontaneous breathing, nonlabored ventilation, respiratory function stable and patient connected to nasal cannula oxygen Cardiovascular status: stable and blood pressure returned to baseline Anesthetic complications: no Comments: Not actually a MAC case. Straight LOCAL because of first trimester pregnancy       Last Vitals:  Vitals:   10/17/16 1722 10/17/16 1735  BP: 124/84 124/89  Pulse: 93 83  Resp: 18 19  Temp:  36.4 C    Last Pain:  Vitals:   10/17/16 1421  TempSrc: Oral                 Montez Hageman

## 2016-10-21 ENCOUNTER — Emergency Department (HOSPITAL_COMMUNITY): Payer: BLUE CROSS/BLUE SHIELD

## 2016-10-21 ENCOUNTER — Encounter (HOSPITAL_COMMUNITY): Payer: Self-pay | Admitting: Emergency Medicine

## 2016-10-21 ENCOUNTER — Emergency Department (HOSPITAL_COMMUNITY)
Admission: EM | Admit: 2016-10-21 | Discharge: 2016-10-21 | Disposition: A | Discharge status: Home / Self Care | Payer: BLUE CROSS/BLUE SHIELD | Attending: Emergency Medicine | Admitting: Emergency Medicine

## 2016-10-21 DIAGNOSIS — Z79899 Other long term (current) drug therapy: Secondary | ICD-10-CM | POA: Insufficient documentation

## 2016-10-21 DIAGNOSIS — O208 Other hemorrhage in early pregnancy: Secondary | ICD-10-CM | POA: Diagnosis not present

## 2016-10-21 DIAGNOSIS — Z3A00 Weeks of gestation of pregnancy not specified: Secondary | ICD-10-CM | POA: Insufficient documentation

## 2016-10-21 DIAGNOSIS — Z87891 Personal history of nicotine dependence: Secondary | ICD-10-CM | POA: Diagnosis not present

## 2016-10-21 DIAGNOSIS — N939 Abnormal uterine and vaginal bleeding, unspecified: Secondary | ICD-10-CM

## 2016-10-21 DIAGNOSIS — Z3490 Encounter for supervision of normal pregnancy, unspecified, unspecified trimester: Secondary | ICD-10-CM

## 2016-10-21 DIAGNOSIS — O209 Hemorrhage in early pregnancy, unspecified: Secondary | ICD-10-CM | POA: Diagnosis not present

## 2016-10-21 LAB — I-STAT BETA HCG BLOOD, ED (MC, WL, AP ONLY): HCG, QUANTITATIVE: 86.1 m[IU]/mL — AB (ref <5)

## 2016-10-21 LAB — BASIC METABOLIC PANEL
Anion gap: 8 (ref 5–15)
BUN: 9 mg/dL (ref 6–20)
CO2: 26 mmol/L (ref 22–32)
CREATININE: 0.9 mg/dL (ref 0.44–1.00)
Calcium: 9.2 mg/dL (ref 8.9–10.3)
Chloride: 102 mmol/L (ref 101–111)
GFR calc Af Amer: >60 mL/min (ref >60)
Glucose, Bld: 105 mg/dL — ABNORMAL HIGH (ref 65–99)
Potassium: 3.7 mmol/L (ref 3.5–5.1)
SODIUM: 136 mmol/L (ref 135–145)

## 2016-10-21 LAB — CBC
HCT: 35.4 % — ABNORMAL LOW (ref 36.0–46.0)
Hemoglobin: 11.5 g/dL — ABNORMAL LOW (ref 12.0–15.0)
MCH: 28.4 pg (ref 26.0–34.0)
MCHC: 32.5 g/dL (ref 30.0–36.0)
MCV: 87.4 fL (ref 78.0–100.0)
PLATELETS: 396 10*3/uL (ref 150–400)
RBC: 4.05 MIL/uL (ref 3.87–5.11)
RDW: 12.5 % (ref 11.5–15.5)
WBC: 4.1 10*3/uL (ref 4.0–10.5)

## 2016-10-21 NOTE — ED Triage Notes (Signed)
Pt verbalizes continuous vaginal bleeding described a spotting since 2/14.Pt continues to verbalize heavy vaginal bleeding since Tuesday. Monday had incision and drainage under left breast. Pt states had possible pregnancy test on Monday.

## 2016-10-21 NOTE — ED Provider Notes (Signed)
[facility]-EMERGENCY DEPT Provider Note   CSN: [id number] Arrival date & time: 10/21/16  4782     History   Chief Complaint Chief Complaint  Patient presents with  . Vaginal Bleeding    HPI Amanda Peters is a 31 y.o. female.  HPI Patient presents emergency department complaints of vaginal bleeding and spotting since February 14.  She reports she did have heavier bleeding on Tuesday.  No fevers or chills.  No weakness.  No lightheadedness or near syncope.  Denies significant abdominal pain.  No history of ectopic.  This would be her first pregnancy.  She has a sexually-transmitted infection before.  No dysuria or vaginal complaints at this time    Past Medical History:  Diagnosis Date  . Breast abscess    left  . Headache    migraines  . Psoriasis     Patient Active Problem List   Diagnosis Date Noted  . Pain in joint, shoulder region 08/23/2016  . Migraines 01/21/2016  . Dermatitis 10/28/2013  . Obesity 10/28/2013    Past Surgical History:  Procedure Laterality Date  . INCISION AND DRAINAGE ABSCESS Left 10/17/2016   Procedure: INCISION AND DRAINAGE LEFT BREAST ABSCESS;  Surgeon: Chevis Pretty III, MD;  Location: [facility] OR;  Service: General;  Laterality: Left;  . NO PAST SURGERIES      OB History    Gravida Para Term Preterm AB Living   0 0 0 0 0 0   SAB TAB Ectopic Multiple Live Births   0 0 0 0         Home Medications    Prior to Admission medications   Medication Sig Start Date End Date Taking? Authorizing Provider  cephALEXin (KEFLEX) 500 MG capsule Take 1 capsule (500 mg total) by mouth 4 (four) times daily. Patient taking differently: Take 500 mg by mouth 4 (four) times daily. Started 02/26 for 10 days 10/17/16  Yes Chevis Pretty III, MD  acetaminophen (TYLENOL) 500 MG tablet Take 1,000 mg by mouth every 6 (six) hours as needed for moderate pain or headache.    Historical Provider, MD  desonide (DESOWEN) 0.05 % cream Apply 1 application topically 2 (two) times  daily.  01/09/16   Historical Provider, MD  EPIDUO FORTE 0.3-2.5 % GEL Apply 1 application topically at bedtime. 02/18/16   Historical Provider, MD  HUMIRA PEN 40 MG/0.8ML PNKT Inject 40 mg as directed every 14 (fourteen) days.  07/01/16   Historical Provider, MD  HYDROcodone-acetaminophen (NORCO/VICODIN) 5-325 MG tablet Take 1-2 tablets by mouth every 4 (four) hours as needed for moderate pain or severe pain. 10/17/16   Chevis Pretty III, MD  ibuprofen (ADVIL,MOTRIN) 800 MG tablet Take 1 tablet (800 mg total) by mouth every 8 (eight) hours as needed for mild pain or moderate pain. Patient not taking: Reported on 10/14/2016 10/06/15   Brock Bad, MD  meloxicam (MOBIC) 15 MG tablet Take 1 tablet (15 mg total) by mouth daily. Patient not taking: Reported on 10/14/2016 08/23/16   Pincus Sanes, MD  phentermine 37.5 MG capsule Take 1 capsule (37.5 mg total) by mouth every morning. 07/20/16   Brock Bad, MD  SUMAtriptan (IMITREX) 100 MG tablet Take 1 tablet (100 mg total) by mouth every 2 (two) hours as needed for migraine. May repeat in 2 hours if headache persists or recurs. 01/21/16   Pincus Sanes, MD  Tetrahydrozoline HCl (REDNESS RELIEVER EYE DROPS OP) Apply 1 drop to eye daily as needed (red eyes).  Historical Provider, MD    Family History Family History  Problem Relation Age of Onset  . Hypothyroidism Mother   . Hypertension Father     Social History Social History  Substance Use Topics  . Smoking status: Former Smoker    Types: Cigarettes  . Smokeless tobacco: Never Used  . Alcohol use 0.0 oz/week     Comment: Ocassionally     Allergies   Patient has no known allergies.   Review of Systems Review of Systems  All other systems reviewed and are negative.    Physical Exam Updated Vital Signs BP 125/85 (BP Location: Left Arm)   Pulse 87   Temp 97.5 F (36.4 C) (Oral)   Resp 16   LMP 10/05/2016 (Exact Date)   SpO2 100%   Physical Exam  Constitutional: She is  oriented to person, place, and time. She appears well-developed and well-nourished. No distress.  HENT:  Head: Normocephalic and atraumatic.  Eyes: EOM are normal.  Neck: Normal range of motion.  Cardiovascular: Normal rate.   Pulmonary/Chest: Effort normal and breath sounds normal.  Abdominal: Soft. She exhibits no distension. There is no tenderness.  Musculoskeletal: Normal range of motion.  Neurological: She is alert and oriented to person, place, and time.  Skin: Skin is warm and dry.  Psychiatric: She has a normal mood and affect. Judgment normal.  Nursing note and vitals reviewed.    ED Treatments / Results  Labs (all labs ordered are listed, but only abnormal results are displayed) Labs Reviewed  CBC - Abnormal; Notable for the following:       Result Value   Hemoglobin 11.5 (*)    HCT 35.4 (*)    All other components within normal limits  BASIC METABOLIC PANEL - Abnormal; Notable for the following:    Glucose, Bld 105 (*)    All other components within normal limits  I-STAT BETA HCG BLOOD, ED ([facility], [facility], [facility] ONLY) - Abnormal; Notable for the following:    I-stat hCG, quantitative 86.1 (*)    All other components within normal limits    EKG  EKG Interpretation None       Radiology [place] Ob Comp < 14 Wks  Result Date: 10/21/2016 CLINICAL DATA:  Vaginal bleeding for 4 days EXAM: OBSTETRIC <14 WK US AND TRANSVAGINAL OB US TECHNIQUE: Both transabdominal and transvaginal ultrasound examinations were performed for complete evaluation of the gestation as well as the maternal uterus, adnexal regions, and pelvic cul-de-sac. Transvaginal technique was performed to assess early pregnancy. COMPARISON:  None. FINDINGS: Intrauterine gestational sac: Not visualized Yolk sac:  Not visualized Embryo:  Not visualized Cardiac Activity: Heart Rate:   bpm MSD:   mm    w     d CRL:    mm    w    d                  US EDC: Subchorionic hemorrhage:  None visualized. Maternal uterus/adnexae: No  adnexal masses or free fluid. Hypoechoic area noted within the cervix, possibly nabothian cyst. IMPRESSION: No intrauterine pregnancy visualized. Differential considerations would include early intrauterine pregnancy too early to visualize, spontaneous abortion, or occult ectopic pregnancy. Recommend close clinical followup and serial quantitative beta HCGs and ultrasounds. Electronically Signed   By: Charlett NoseKevin  Dover M.D.   On: 10/21/2016 10:34   [place] Ob Transvaginal  Result Date: 10/21/2016 CLINICAL DATA:  Vaginal bleeding for 4 days EXAM: OBSTETRIC <14 WK US AND TRANSVAGINAL OB US TECHNIQUE:  Both transabdominal and transvaginal ultrasound examinations were performed for complete evaluation of the gestation as well as the maternal uterus, adnexal regions, and pelvic cul-de-sac. Transvaginal technique was performed to assess early pregnancy. COMPARISON:  None. FINDINGS: Intrauterine gestational sac: Not visualized Yolk sac:  Not visualized Embryo:  Not visualized Cardiac Activity: Heart Rate:   bpm MSD:   mm    w     d CRL:    mm    w    d                  [place] EDC: Subchorionic hemorrhage:  None visualized. Maternal uterus/adnexae: No adnexal masses or free fluid. Hypoechoic area noted within the cervix, possibly nabothian cyst. IMPRESSION: No intrauterine pregnancy visualized. Differential considerations would include early intrauterine pregnancy too early to visualize, spontaneous abortion, or occult ectopic pregnancy. Recommend close clinical followup and serial quantitative beta HCGs and ultrasounds. Electronically Signed   By: Charlett Nose M.D.   On: 10/21/2016 10:34    Procedures Procedures (including critical care time)  Medications Ordered in ED Medications - No data to display   Initial Impression / Assessment and Plan / ED Course  I have reviewed the triage vital signs and the nursing notes.  Pertinent labs & imaging results that were available during my care of the patient were reviewed by me  and considered in my medical decision making (see chart for details).     Quant 86.  Transvaginal ultrasound demonstrates no intrauterine pregnancy.  She will need 72 hour repeat Quant.  She understands return to the ER for new or worsening symptoms including worsening bleeding or development of lightheadedness or near syncope.  Vital signs are stable.  Discharge good condition.  Suspect near complete miscarriage  Final Clinical Impressions(s) / ED Diagnoses   Final diagnoses:  Vaginal bleeding  Pregnancy, unspecified gestational age    [facility] Prescriptions New Prescriptions   No medications on file     Azalia Bilis, MD 10/21/16 1048

## 2016-10-22 LAB — AEROBIC/ANAEROBIC CULTURE W GRAM STAIN (SURGICAL/DEEP WOUND)

## 2016-10-22 LAB — AEROBIC/ANAEROBIC CULTURE (SURGICAL/DEEP WOUND)

## 2016-10-23 ENCOUNTER — Inpatient Hospital Stay (HOSPITAL_COMMUNITY)
Admission: AD | Admit: 2016-10-23 | Discharge: 2016-10-23 | Disposition: A | Discharge status: Home / Self Care | Payer: BLUE CROSS/BLUE SHIELD | Source: Ambulatory Visit | Attending: Obstetrics and Gynecology | Admitting: Obstetrics and Gynecology

## 2016-10-23 ENCOUNTER — Encounter (HOSPITAL_COMMUNITY): Payer: Self-pay

## 2016-10-23 DIAGNOSIS — Z87891 Personal history of nicotine dependence: Secondary | ICD-10-CM | POA: Diagnosis not present

## 2016-10-23 DIAGNOSIS — Z79899 Other long term (current) drug therapy: Secondary | ICD-10-CM | POA: Insufficient documentation

## 2016-10-23 DIAGNOSIS — O039 Complete or unspecified spontaneous abortion without complication: Secondary | ICD-10-CM

## 2016-10-23 DIAGNOSIS — Z9889 Other specified postprocedural states: Secondary | ICD-10-CM | POA: Insufficient documentation

## 2016-10-23 LAB — WET PREP, GENITAL
SPERM: NONE SEEN
TRICH WET PREP: NONE SEEN
YEAST WET PREP: NONE SEEN

## 2016-10-23 LAB — CBC
HEMATOCRIT: 35.3 % — AB (ref 36.0–46.0)
Hemoglobin: 11.8 g/dL — ABNORMAL LOW (ref 12.0–15.0)
MCH: 29.5 pg (ref 26.0–34.0)
MCHC: 33.4 g/dL (ref 30.0–36.0)
MCV: 88.3 fL (ref 78.0–100.0)
PLATELETS: 388 10*3/uL (ref 150–400)
RBC: 4 MIL/uL (ref 3.87–5.11)
RDW: 12.5 % (ref 11.5–15.5)
WBC: 4.8 10*3/uL (ref 4.0–10.5)

## 2016-10-23 LAB — URINALYSIS, ROUTINE W REFLEX MICROSCOPIC
Bilirubin Urine: NEGATIVE
Glucose, UA: NEGATIVE mg/dL
Hgb urine dipstick: NEGATIVE
Ketones, ur: NEGATIVE mg/dL
Leukocytes, UA: NEGATIVE
NITRITE: NEGATIVE
Protein, ur: NEGATIVE mg/dL
SPECIFIC GRAVITY, URINE: 1.015 (ref 1.005–1.030)
pH: 6 (ref 5.0–8.0)

## 2016-10-23 LAB — HCG, QUANTITATIVE, PREGNANCY: hCG, Beta Chain, Quant, S: 22 m[IU]/mL — ABNORMAL HIGH (ref <5)

## 2016-10-23 NOTE — MAU Note (Signed)
Pt states LMP was 2/14-lighter than normal. Had breast surgery on 2/26-found out she was pregnant. Started having some heavy vaginal bleeding like a period on Wednesday. Pt denies pain at this time.

## 2016-10-23 NOTE — MAU Provider Note (Signed)
Chief Complaint: Vaginal Bleeding   First Provider Initiated Contact with Patient 10/23/16 2218      SUBJECTIVE HPI: Amanda Peters is a 31 y.o. G1P0000 at [redacted]w[redacted]d by LMP who presents to maternity admissions reporting spotting off and on since 2/14 with onset of menstrual-like bleeding 4 days ago.  She denies pain.  She reports positive pregnancy test 1 week ago when she had breast surgery scheduled, then a quant hcg of 86.1 at [facility] on 10/21/16 with no evidence of ectopic or IUP on [place]. She presents to MAU today reporting bleeding is moderate in amount but intermittent. She has not tried any treatments, nothing makes it better or worse.  There are no associated symptoms except mild h/a. She took Tylenol 2 hours ago and the h/a is mostly resolved.  She denies vaginal itching/burning, urinary symptoms, dizziness, n/v, or fever/chills.     HPI  Past Medical History:  Diagnosis Date  . Breast abscess    left  . Headache    migraines  . Psoriasis    Past Surgical History:  Procedure Laterality Date  . BREAST SURGERY    . INCISION AND DRAINAGE ABSCESS Left 10/17/2016   Procedure: INCISION AND DRAINAGE LEFT BREAST ABSCESS;  Surgeon: Chevis Pretty III, MD;  Location: [facility] OR;  Service: General;  Laterality: Left;  . NO PAST SURGERIES     Social History   Social History  . Marital status: Single    Spouse name: N/A  . Number of children: N/A  . Years of education: N/A   Occupational History  . Not on file.   Social History Main Topics  . Smoking status: Former Smoker    Types: Cigarettes  . Smokeless tobacco: Never Used  . Alcohol use 0.0 oz/week     Comment: Ocassionally  . Drug use: Yes    Types: Marijuana     Comment: has not in 2 weeks  . Sexual activity: Yes    Partners: Male    Birth control/ protection:    Other Topics Concern  . Not on file   Social History Narrative  . No narrative on file   No current facility-administered medications on file prior to encounter.     Current Outpatient Prescriptions on File Prior to Encounter  Medication Sig Dispense Refill  . acetaminophen (TYLENOL) 500 MG tablet Take 1,000 mg by mouth every 6 (six) hours as needed for moderate pain or headache.    . cephALEXin (KEFLEX) 500 MG capsule Take 1 capsule (500 mg total) by mouth 4 (four) times daily. (Patient taking differently: Take 500 mg by mouth 4 (four) times daily. Started 02/26 for 10 days) 40 capsule 0  . desonide (DESOWEN) 0.05 % cream Apply 1 application topically 2 (two) times daily.     Marland Kitchen EPIDUO FORTE 0.3-2.5 % GEL Apply 1 application topically at bedtime.    Marland Kitchen HUMIRA PEN 40 MG/0.8ML PNKT Inject 40 mg as directed every 14 (fourteen) days.     Marland Kitchen HYDROcodone-acetaminophen (NORCO/VICODIN) 5-325 MG tablet Take 1-2 tablets by mouth every 4 (four) hours as needed for moderate pain or severe pain. 20 tablet 0  . ibuprofen (ADVIL,MOTRIN) 800 MG tablet Take 1 tablet (800 mg total) by mouth every 8 (eight) hours as needed for mild pain or moderate pain. (Patient not taking: Reported on 10/14/2016) 30 tablet 8  . meloxicam (MOBIC) 15 MG tablet Take 1 tablet (15 mg total) by mouth daily. (Patient not taking: Reported on 10/14/2016) 30 tablet 0  .  phentermine 37.5 MG capsule Take 1 capsule (37.5 mg total) by mouth every morning. 30 capsule 2  . SUMAtriptan (IMITREX) 100 MG tablet Take 1 tablet (100 mg total) by mouth every 2 (two) hours as needed for migraine. May repeat in 2 hours if headache persists or recurs. 10 tablet 5  . Tetrahydrozoline HCl (REDNESS RELIEVER EYE DROPS OP) Apply 1 drop to eye daily as needed (red eyes).     No Known Allergies  ROS:  Review of Systems  Constitutional: Negative for chills, fatigue and fever.  HENT: Negative for sinus pressure.   Eyes: Negative for photophobia.  Respiratory: Negative for shortness of breath.   Cardiovascular: Negative for chest pain.  Gastrointestinal: Negative for constipation, diarrhea, nausea and vomiting.   Genitourinary: Positive for vaginal bleeding. Negative for difficulty urinating, dysuria, flank pain, frequency, pelvic pain, vaginal discharge and vaginal pain.  Musculoskeletal: Negative for neck pain.  Neurological: Negative for dizziness, weakness and headaches.  Psychiatric/Behavioral: Negative.      I have reviewed patient's Past Medical Hx, Surgical Hx, Family Hx, Social Hx, medications and allergies.   Physical Exam   Patient Vitals for the past 24 hrs:  BP Temp Pulse Resp Height Weight  10/23/16 2313 120/76 - 99 16 - -  10/23/16 2059 122/75 98 F (36.7 C) 79 16 - -  10/23/16 2054 - - - - 5\' 3"  (1.6 m) 184 lb (83.5 kg)   Constitutional: Well-developed, well-nourished female in no acute distress.  Cardiovascular: normal rate Respiratory: normal effort GI: Abd soft, non-tender. Pos BS x 4 MS: Extremities nontender, no edema, normal ROM Neurologic: Alert and oriented x 4.  GU: Neg CVAT.  PELVIC EXAM: Cervix pink, visually closed, without lesion, small amount dark red bleeding, vaginal walls and external genitalia normal Bimanual exam: Cervix 0/long/high, firm, anterior, neg CMT, uterus nontender, nonenlarged, adnexa without tenderness, enlargement, or mass    LAB RESULTS Results for orders placed or performed during the hospital encounter of 10/23/16 (from the past 24 hour(s))  Urinalysis, Routine w reflex microscopic     Status: None   Collection Time: 10/23/16  9:33 PM  Result Value Ref Range   Color, Urine YELLOW YELLOW   APPearance CLEAR CLEAR   Specific Gravity, Urine 1.015 1.005 - 1.030   pH 6.0 5.0 - 8.0   Glucose, UA NEGATIVE NEGATIVE mg/dL   Hgb urine dipstick NEGATIVE NEGATIVE   Bilirubin Urine NEGATIVE NEGATIVE   Ketones, ur NEGATIVE NEGATIVE mg/dL   Protein, ur NEGATIVE NEGATIVE mg/dL   Nitrite NEGATIVE NEGATIVE   Leukocytes, UA NEGATIVE NEGATIVE  CBC     Status: Abnormal   Collection Time: 10/23/16  9:45 PM  Result Value Ref Range   WBC 4.8 4.0 -  10.5 K/uL   RBC 4.00 3.87 - 5.11 MIL/uL   Hemoglobin 11.8 (L) 12.0 - 15.0 g/dL   HCT 95.635.3 (L) 21.336.0 - 08.646.0 %   MCV 88.3 78.0 - 100.0 fL   MCH 29.5 26.0 - 34.0 pg   MCHC 33.4 30.0 - 36.0 g/dL   RDW 57.812.5 46.911.5 - 62.915.5 %   Platelets 388 150 - 400 K/uL  hCG, quantitative, pregnancy     Status: Abnormal   Collection Time: 10/23/16  9:45 PM  Result Value Ref Range   hCG, Beta Chain, Quant, S 22 (H) <5 mIU/mL  Wet prep, genital     Status: Abnormal   Collection Time: 10/23/16 10:18 PM  Result Value Ref Range   Yeast Wet Prep HPF  POC NONE SEEN NONE SEEN   Trich, Wet Prep NONE SEEN NONE SEEN   Clue Cells Wet Prep HPF POC PRESENT (A) NONE SEEN   WBC, Wet Prep HPF POC FEW (A) NONE SEEN   Sperm NONE SEEN        IMAGING US Ob Comp < 14 Wks  Result Date: 10/21/2016 CLINICAL DATA:  Vaginal bleeding for 4 days EXAM: OBSTETRIC <14 WK [place] AND TRANSVAGINAL OB US TECHNIQUE: Both transabdominal and transvaginal ultrasound examinations were performed for complete evaluation of the gestation as well as the maternal uterus, adnexal regions, and pelvic cul-de-sac. Transvaginal technique was performed to assess early pregnancy. COMPARISON:  None. FINDINGS: Intrauterine gestational sac: Not visualized Yolk sac:  Not visualized Embryo:  Not visualized Cardiac Activity: Heart Rate:   bpm MSD:   mm    w     d CRL:    mm    w    d                  [place] EDC: Subchorionic hemorrhage:  None visualized. Maternal uterus/adnexae: No adnexal masses or free fluid. Hypoechoic area noted within the cervix, possibly nabothian cyst. IMPRESSION: No intrauterine pregnancy visualized. Differential considerations would include early intrauterine pregnancy too early to visualize, spontaneous abortion, or occult ectopic pregnancy. Recommend close clinical followup and serial quantitative beta HCGs and ultrasounds. Electronically Signed   By: Charlett Nose M.D.   On: 10/21/2016 10:34   US Ob Transvaginal  Result Date: 10/21/2016 CLINICAL  DATA:  Vaginal bleeding for 4 days EXAM: OBSTETRIC <14 WK [place] AND TRANSVAGINAL OB US TECHNIQUE: Both transabdominal and transvaginal ultrasound examinations were performed for complete evaluation of the gestation as well as the maternal uterus, adnexal regions, and pelvic cul-de-sac. Transvaginal technique was performed to assess early pregnancy. COMPARISON:  None. FINDINGS: Intrauterine gestational sac: Not visualized Yolk sac:  Not visualized Embryo:  Not visualized Cardiac Activity: Heart Rate:   bpm MSD:   mm    w     d CRL:    mm    w    d                  [place] EDC: Subchorionic hemorrhage:  None visualized. Maternal uterus/adnexae: No adnexal masses or free fluid. Hypoechoic area noted within the cervix, possibly nabothian cyst. IMPRESSION: No intrauterine pregnancy visualized. Differential considerations would include early intrauterine pregnancy too early to visualize, spontaneous abortion, or occult ectopic pregnancy. Recommend close clinical followup and serial quantitative beta HCGs and ultrasounds. Electronically Signed   By: Charlett Nose M.D.   On: 10/21/2016 10:34   US Breast Aspiration Left  Addendum Date: 10/03/2016   ADDENDUM REPORT: 09/30/2016 12:06 ADDENDUM: Left breast aspiration yielded Gram Stain-MODERATE WBC PRESENT,BOTH PMN AND MONONUCLEAR, FEW GRAM POSITIVE COCCI IN PAIRS. Culture-ABUNDANT STREPTOCOCCUS GROUP C MIXED ANAEROBIC FLORA PRESENT. I notified the patient of results by telephone and her questions were answered. The patient reported increased hardening of the affected area. She was instructed to call for any additional questions or concerns. Surgical consultation has been arranged with Dr. Chevis Pretty at [facility] on October 04, 2016. Pathology results reported by Rene Kocher, RN on 09/30/2016. Electronically Signed   By: Ted Mcalpine M.D.   On: 09/30/2016 12:06   Result Date: 10/03/2016 CLINICAL DATA:  Left breast 10 o'clock fluid collection. EXAM: ULTRASOUND  GUIDED LEFT BREAST CYST ASPIRATION COMPARISON:  Previous exams. PROCEDURE: Using sterile technique, 1% lidocaine, under direct ultrasound  visualization, needle aspiration of left breast 10 o'clock fluid collection was performed. 3 cc thick cream colored fluid was aspirated and sent to microbiology. However, after aspiration there was a significant residual complex cystic mass versus walled off fluid collection. Therefore, core needle biopsy of this area was performed. IMPRESSION: Ultrasound-guided aspiration of right breast 10 o'clock fluid collection No apparent complications. RECOMMENDATIONS: Clinical follow-up depending on pathology results from core needle biopsy of the same area. Electronically Signed: By: Ted Mcalpine M.D. On: 09/20/2016 16:44    MAU Management/MDM: Ordered labs and reviewed results.  Significant drop in hcg today c/w SAB.  Will follow in office in 1 week.  Wet prep with clue cells but no s/sx per pt so will not treat, likely contaminated r/t bleeding.  Bleeding precautions/reasons to return to MAU reviewed.  Pt stable at time of discharge.  ASSESSMENT 1. SAB (spontaneous abortion)     PLAN Discharge home  Allergies as of 10/23/2016   No Known Allergies     Medication List    STOP taking these medications   meloxicam 15 MG tablet Commonly known as:  MOBIC     TAKE these medications   acetaminophen 500 MG tablet Commonly known as:  TYLENOL Take 1,000 mg by mouth every 6 (six) hours as needed for moderate pain or headache.   cephALEXin 500 MG capsule Commonly known as:  KEFLEX Take 1 capsule (500 mg total) by mouth 4 (four) times daily. What changed:  additional instructions   desonide 0.05 % cream Commonly known as:  DESOWEN Apply 1 application topically 2 (two) times daily.   EPIDUO FORTE 0.3-2.5 % Gel Generic drug:  Adapalene-Benzoyl Peroxide Apply 1 application topically at bedtime.   HUMIRA PEN 40 MG/0.8ML Pnkt Generic drug:   Adalimumab Inject 40 mg as directed every 14 (fourteen) days.   HYDROcodone-acetaminophen 5-325 MG tablet Commonly known as:  NORCO/VICODIN Take 1-2 tablets by mouth every 4 (four) hours as needed for moderate pain or severe pain.   ibuprofen 800 MG tablet Commonly known as:  ADVIL,MOTRIN Take 1 tablet (800 mg total) by mouth every 8 (eight) hours as needed for mild pain or moderate pain.   phentermine 37.5 MG capsule Take 1 capsule (37.5 mg total) by mouth every morning.   REDNESS RELIEVER EYE DROPS OP Apply 1 drop to eye daily as needed (red eyes).   SUMAtriptan 100 MG tablet Commonly known as:  IMITREX Take 1 tablet (100 mg total) by mouth every 2 (two) hours as needed for migraine. May repeat in 2 hours if headache persists or recurs.      Follow-up Information    [facility] Follow up.   Why:  The office will call you with an appointment in 1 week.  Return to MAU as needed for emergencies. Contact information: [address] [place] [address] [phone]          Sharen Counter Certified Nurse-Midwife 10/23/2016  11:31 PM

## 2016-10-23 NOTE — Discharge Instructions (Signed)

## 2016-10-24 LAB — GC/CHLAMYDIA PROBE AMP (~~LOC~~) NOT AT ARMC
Chlamydia: NEGATIVE
Neisseria Gonorrhea: NEGATIVE

## 2016-10-25 LAB — RPR: RPR: NONREACTIVE

## 2016-10-25 LAB — HIV ANTIBODY (ROUTINE TESTING W REFLEX): HIV Screen 4th Generation wRfx: NONREACTIVE

## 2016-10-28 ENCOUNTER — Ambulatory Visit: Payer: BLUE CROSS/BLUE SHIELD | Admitting: Obstetrics

## 2016-11-07 ENCOUNTER — Ambulatory Visit: Payer: BLUE CROSS/BLUE SHIELD | Admitting: Obstetrics

## 2017-01-02 ENCOUNTER — Encounter: Payer: Self-pay | Admitting: Obstetrics

## 2017-01-02 ENCOUNTER — Ambulatory Visit (INDEPENDENT_AMBULATORY_CARE_PROVIDER_SITE_OTHER): Payer: BLUE CROSS/BLUE SHIELD | Admitting: Obstetrics

## 2017-01-02 VITALS — BP 121/75 | HR 96 | Ht 62.25 in | Wt 185.0 lb

## 2017-01-02 DIAGNOSIS — Z113 Encounter for screening for infections with a predominantly sexual mode of transmission: Secondary | ICD-10-CM | POA: Diagnosis not present

## 2017-01-02 DIAGNOSIS — O039 Complete or unspecified spontaneous abortion without complication: Secondary | ICD-10-CM | POA: Diagnosis not present

## 2017-01-02 DIAGNOSIS — Z3169 Encounter for other general counseling and advice on procreation: Secondary | ICD-10-CM

## 2017-01-02 DIAGNOSIS — N898 Other specified noninflammatory disorders of vagina: Secondary | ICD-10-CM | POA: Diagnosis not present

## 2017-01-02 DIAGNOSIS — N944 Primary dysmenorrhea: Secondary | ICD-10-CM

## 2017-01-02 MED ORDER — IBUPROFEN 800 MG PO TABS: 800.0000 mg | ORAL_TABLET | Freq: Three times a day (TID) | ORAL | 5 refills | Status: DC | PRN

## 2017-01-02 MED ORDER — PRENATE MINI 29-0.6-0.4-350 MG PO CAPS: 1.0000 | ORAL_CAPSULE | Freq: Every day | ORAL | 11 refills | Status: DC

## 2017-01-02 NOTE — Progress Notes (Signed)
Last Pap 11/05/2015 Normal

## 2017-01-02 NOTE — Addendum Note (Signed)
Addended by: Hamilton CapriBURCH, Jax Abdelrahman J on: 01/02/2017 11:24 AM   Modules accepted: Orders

## 2017-01-02 NOTE — Progress Notes (Signed)
Patient ID: Amanda Peters, female   DOB: [birth year], 31 y.o.   MRN: [id number]  Chief Complaint  Patient presents with  . Gynecologic Exam    HPI Amanda Peters is a 31 y.o. female.  Patient had SAB in March.  Presents for follow up.  Had a period in April that was very painful, but normal.  Desires future conception, and therefore does not want contraception. HPI  Past Medical History:  Diagnosis Date  . Breast abscess    left  . Headache    migraines  . Psoriasis     Past Surgical History:  Procedure Laterality Date  . BREAST SURGERY    . INCISION AND DRAINAGE ABSCESS Left 10/17/2016   Procedure: INCISION AND DRAINAGE LEFT BREAST ABSCESS;  Surgeon: Chevis Pretty III, MD;  Location: [facility] OR;  Service: General;  Laterality: Left;  . NO PAST SURGERIES      Family History  Problem Relation Age of Onset  . Hypothyroidism Mother   . Hypertension Father     Social History Social History  Substance Use Topics  . Smoking status: Former Smoker    Types: Cigarettes  . Smokeless tobacco: Never Used  . Alcohol use 0.0 oz/week     Comment: Ocassionally    No Known Allergies  Current Outpatient Prescriptions  Medication Sig Dispense Refill  . HUMIRA PEN 40 MG/0.8ML PNKT Inject 40 mg as directed every 14 (fourteen) days.     . phentermine 37.5 MG capsule Take 1 capsule (37.5 mg total) by mouth every morning. 30 capsule 2  . acetaminophen (TYLENOL) 500 MG tablet Take 1,000 mg by mouth every 6 (six) hours as needed for moderate pain or headache.    . desonide (DESOWEN) 0.05 % cream Apply 1 application topically 2 (two) times daily.     Marland Kitchen EPIDUO FORTE 0.3-2.5 % GEL Apply 1 application topically at bedtime.    Marland Kitchen ibuprofen (ADVIL,MOTRIN) 800 MG tablet Take 1 tablet (800 mg total) by mouth every 8 (eight) hours as needed for mild pain or moderate pain. (Patient not taking: Reported on 10/14/2016) 30 tablet 8  . ibuprofen (ADVIL,MOTRIN) 800 MG tablet Take 1 tablet (800 mg total) by mouth  every 8 (eight) hours as needed. 30 tablet 5  . Prenat w/o A-FeCbn-Meth-FA-DHA (PRENATE MINI) 29-0.6-0.4-350 MG CAPS Take 1 capsule by mouth daily before breakfast. 30 capsule 11  . SUMAtriptan (IMITREX) 100 MG tablet Take 1 tablet (100 mg total) by mouth every 2 (two) hours as needed for migraine. May repeat in 2 hours if headache persists or recurs. (Patient not taking: Reported on 01/02/2017) 10 tablet 5  . Tetrahydrozoline HCl (REDNESS RELIEVER EYE DROPS OP) Apply 1 drop to eye daily as needed (red eyes).     No current facility-administered medications for this visit.     Review of Systems Review of Systems Constitutional: negative for fatigue and weight loss Respiratory: negative for cough and wheezing Cardiovascular: negative for chest pain, fatigue and palpitations Gastrointestinal: negative for abdominal pain and change in bowel habits Genitourinary:positive for painful period after miscarriage Integument/breast: negative for nipple discharge Musculoskeletal:negative for myalgias Neurological: negative for gait problems and tremors Behavioral/Psych: negative for abusive relationship, depression Endocrine: negative for temperature intolerance      Blood pressure 121/75, pulse 96, height 5' 2.25" (1.581 m), weight 185 lb (83.9 kg), last menstrual period 01/01/2017, unknown if currently breastfeeding.  Physical Exam Physical Exam:  Deferred.  Currently on period.  >% of 10 min visit spent on counseling  and coordination of care.    Data Reviewed CBC  Assessment     S/P Spontaneous Abortion.  Doing well Desires future conception ASAP Preconception Counseling and Advice  Dysmenorrhea    Plan     PNV's Rx for preconception medication Ibuprofen Rx for dysmenorrhea F/U in 6 weeks for Annual  No orders of the defined types were placed in this encounter.  Meds ordered this encounter  Medications  . ibuprofen (ADVIL,MOTRIN) 800 MG tablet    Sig: Take 1 tablet (800 mg  total) by mouth every 8 (eight) hours as needed.    Dispense:  30 tablet    Refill:  5  . Prenat w/o A-FeCbn-Meth-FA-DHA (PRENATE MINI) 29-0.6-0.4-350 MG CAPS    Sig: Take 1 capsule by mouth daily before breakfast.    Dispense:  30 capsule    Refill:  11          Patient ID: Amanda Peters, female   DOB: Jun 27, 1986, 31 y.o.   MRN: [id number]

## 2017-01-03 LAB — GC/CHLAMYDIA PROBE AMP
Chlamydia trachomatis, NAA: NEGATIVE
NEISSERIA GONORRHOEAE BY PCR: NEGATIVE

## 2017-01-24 ENCOUNTER — Encounter: Payer: Self-pay | Admitting: Obstetrics

## 2017-01-24 ENCOUNTER — Ambulatory Visit (INDEPENDENT_AMBULATORY_CARE_PROVIDER_SITE_OTHER): Payer: BLUE CROSS/BLUE SHIELD | Admitting: Obstetrics

## 2017-01-24 VITALS — BP 118/76 | HR 91 | Wt 183.7 lb

## 2017-01-24 DIAGNOSIS — Z113 Encounter for screening for infections with a predominantly sexual mode of transmission: Secondary | ICD-10-CM | POA: Diagnosis not present

## 2017-01-24 DIAGNOSIS — Z124 Encounter for screening for malignant neoplasm of cervix: Secondary | ICD-10-CM

## 2017-01-24 DIAGNOSIS — Z01419 Encounter for gynecological examination (general) (routine) without abnormal findings: Secondary | ICD-10-CM | POA: Diagnosis not present

## 2017-01-24 DIAGNOSIS — Z3169 Encounter for other general counseling and advice on procreation: Secondary | ICD-10-CM

## 2017-01-24 MED ORDER — PRENATE MINI 29-0.6-0.4-350 MG PO CAPS: 1.0000 | ORAL_CAPSULE | Freq: Every day | ORAL | 11 refills | Status: DC

## 2017-01-24 NOTE — Progress Notes (Signed)
Patient is in the office for annual exam. Patient has some questions about her gyn history of possible ovarian cyst and enlarged cyst.

## 2017-01-24 NOTE — Progress Notes (Signed)
Subjective:        Amanda Peters is a 31 y.o. female here for a routine exam.  Current complaints: None.    Personal health questionnaire:  Is patient [name], have a family history of breast and/or ovarian cancer: no Is there a family history of uterine cancer diagnosed at age < 49, gastrointestinal cancer, urinary tract cancer, family member who is a Personnel officer syndrome-associated carrier: no Is the patient overweight and hypertensive, family history of diabetes, personal history of gestational diabetes, preeclampsia or PCOS: no Is patient over 3, have PCOS,  family history of premature CHD under age 33, diabetes, smoke, have hypertension or peripheral artery disease:  no At any time, has a partner hit, kicked or otherwise hurt or frightened you?: no Over the past 2 weeks, have you felt down, depressed or hopeless?: no Over the past 2 weeks, have you felt little interest or pleasure in doing things?:no   Gynecologic History Patient's last menstrual period was 01/01/2017. Contraception: none Last Pap: 2017. Results were: normal Last mammogram: n/a. Results were: n/a  Obstetric History OB History  Gravida Para Term Preterm AB Living  1 0 0 0 1 0  SAB TAB Ectopic Multiple Live Births  1 0 0 0 0    # Outcome Date GA Lbr Len/2nd Weight Sex Delivery Anes PTL Lv  1 SAB               Past Medical History:  Diagnosis Date  . Breast abscess    left  . Headache    migraines  . Psoriasis     Past Surgical History:  Procedure Laterality Date  . BREAST SURGERY    . INCISION AND DRAINAGE ABSCESS Left 10/17/2016   Procedure: INCISION AND DRAINAGE LEFT BREAST ABSCESS;  Surgeon: Chevis Pretty III, MD;  Location: [facility] OR;  Service: General;  Laterality: Left;  . NO PAST SURGERIES       Current Outpatient Prescriptions:  .  acetaminophen (TYLENOL) 500 MG tablet, Take 1,000 mg by mouth every 6 (six) hours as needed for moderate pain or headache., Disp: , Rfl:  .  desonide  (DESOWEN) 0.05 % cream, Apply 1 application topically 2 (two) times daily. , Disp: , Rfl:  .  EPIDUO FORTE 0.3-2.5 % GEL, Apply 1 application topically at bedtime., Disp: , Rfl:  .  HUMIRA PEN 40 MG/0.8ML PNKT, Inject 40 mg as directed every 14 (fourteen) days. , Disp: , Rfl:  .  ibuprofen (ADVIL,MOTRIN) 800 MG tablet, Take 1 tablet (800 mg total) by mouth every 8 (eight) hours as needed., Disp: 30 tablet, Rfl: 5 .  phentermine 37.5 MG capsule, Take 1 capsule (37.5 mg total) by mouth every morning., Disp: 30 capsule, Rfl: 2 .  SUMAtriptan (IMITREX) 100 MG tablet, Take 1 tablet (100 mg total) by mouth every 2 (two) hours as needed for migraine. May repeat in 2 hours if headache persists or recurs., Disp: 10 tablet, Rfl: 5 .  Tetrahydrozoline HCl (REDNESS RELIEVER EYE DROPS OP), Apply 1 drop to eye daily as needed (red eyes)., Disp: , Rfl:  .  Prenat w/o A-FeCbn-Meth-FA-DHA (PRENATE MINI) 29-0.6-0.4-350 MG CAPS, Take 1 capsule by mouth daily before breakfast., Disp: 30 capsule, Rfl: 11 No Known Allergies  Social History  Substance Use Topics  . Smoking status: Former Smoker    Types: Cigarettes  . Smokeless tobacco: Never Used  . Alcohol use 0.0 oz/week     Comment: Ocassionally    Family History  Problem  Relation Age of Onset  . Hypothyroidism Mother   . Hypertension Father       Review of Systems  Constitutional: negative for fatigue and weight loss Respiratory: negative for cough and wheezing Cardiovascular: negative for chest pain, fatigue and palpitations Gastrointestinal: negative for abdominal pain and change in bowel habits Musculoskeletal:negative for myalgias Neurological: negative for gait problems and tremors Behavioral/Psych: negative for abusive relationship, depression Endocrine: negative for temperature intolerance    Genitourinary:negative for abnormal menstrual periods, genital lesions, hot flashes, sexual problems and vaginal discharge Integument/breast: negative  for breast lump, breast tenderness, nipple discharge and skin lesion(s)    Objective:       BP 118/76   Pulse 91   Wt 183 lb 11.2 oz (83.3 kg)   LMP 01/01/2017   BMI 33.33 kg/m  General:   alert  Skin:   no rash or abnormalities  Lungs:   clear to auscultation bilaterally  Heart:   regular rate and rhythm, S1, S2 normal, no murmur, click, rub or gallop  Breasts:   normal without suspicious masses, skin or nipple changes or axillary nodes  Abdomen:  normal findings: no organomegaly, soft, non-tender and no hernia  Pelvis:  External genitalia: normal general appearance Urinary system: urethral meatus normal and bladder without fullness, nontender Vaginal: normal without tenderness, induration or masses Cervix: normal appearance Adnexa: normal bimanual exam Uterus: anteverted and non-tender, normal size   Lab Review Urine pregnancy test Labs reviewed yes Radiologic studies reviewed no  50% of 20 min visit spent on counseling and coordination of care.    Assessment:    Healthy female exam.    Preconception Counseling and Advice   Plan:   PNV's Rx for fetal neuroprophylaxis  Education reviewed: calcium supplements, depression evaluation, low fat, low cholesterol diet, safe sex/STD prevention, self breast exams and weight bearing exercise. Contraception: none. Follow up in: 1 year.   Meds ordered this encounter  Medications  . Prenat w/o A-FeCbn-Meth-FA-DHA (PRENATE MINI) 29-0.6-0.4-350 MG CAPS    Sig: Take 1 capsule by mouth daily before breakfast.    Dispense:  30 capsule    Refill:  11   No orders of the defined types were placed in this encounter.    Patient ID: Amanda Peters, female   DOB: 02/15/1986, 31 y.o.   MRN: [id number]

## 2017-01-26 LAB — CERVICOVAGINAL ANCILLARY ONLY
BACTERIAL VAGINITIS: NEGATIVE
CANDIDA VAGINITIS: NEGATIVE
Chlamydia: NEGATIVE
Neisseria Gonorrhea: NEGATIVE
TRICH (WINDOWPATH): NEGATIVE

## 2017-01-27 LAB — CYTOLOGY - PAP
DIAGNOSIS: NEGATIVE
HPV (WINDOPATH): NOT DETECTED

## 2017-02-13 ENCOUNTER — Ambulatory Visit: Payer: BLUE CROSS/BLUE SHIELD | Admitting: Obstetrics

## 2017-02-16 ENCOUNTER — Telehealth: Payer: Self-pay | Admitting: *Deleted

## 2017-02-16 NOTE — Telephone Encounter (Signed)
Spoke with pt regarding PNV Rx that was sent by Dr Clearance CootsHarper. Rx is not covered by ins. Pt made aware if she would like Rx sent she would need to contact ins to determine what PNV they cover. Pt also made aware that she may also take an OTC vitamin if she chooses.  Pt states she will discuss with provider at next appt, not urgent need since she is not pregnant.   Pt advised to contact office if she speaks with ins and would like Rx.

## 2017-02-28 ENCOUNTER — Encounter: Payer: Self-pay | Admitting: Nurse Practitioner

## 2017-02-28 ENCOUNTER — Ambulatory Visit (INDEPENDENT_AMBULATORY_CARE_PROVIDER_SITE_OTHER): Payer: BLUE CROSS/BLUE SHIELD | Admitting: Nurse Practitioner

## 2017-02-28 VITALS — BP 122/86 | HR 75 | Temp 98.8°F | Ht 62.25 in | Wt 184.0 lb

## 2017-02-28 DIAGNOSIS — H6121 Impacted cerumen, right ear: Secondary | ICD-10-CM

## 2017-02-28 DIAGNOSIS — L309 Dermatitis, unspecified: Secondary | ICD-10-CM | POA: Diagnosis not present

## 2017-02-28 MED ORDER — CARBAMIDE PEROXIDE 6.5 % OT SOLN: 5.0000 [drp] | Freq: Two times a day (BID) | OTIC | 0 refills | Status: DC

## 2017-02-28 MED ORDER — METHYLPREDNISOLONE ACETATE 40 MG/ML IJ SUSP
40.0000 mg | Freq: Once | INTRAMUSCULAR | Status: AC
  Administered 2017-02-28: 40 mg via INTRAMUSCULAR

## 2017-02-28 NOTE — Progress Notes (Signed)
Subjective:  Patient ID: Amanda Peters, female    DOB: [birth year]  Age: 31 y.o. MRN: [id number]  CC: Allergic Reaction (bumps on arms when she got her hair done (hair dye) a mo ago/ ears pain and itching 5 days. )   Rash  This is a new problem. The current episode started 1 to 4 weeks ago. The problem is unchanged. The rash is diffuse. The rash is characterized by itchiness and scaling. She was exposed to chemicals (new hair dye.). Pertinent negatives include no anorexia, congestion, cough, diarrhea, eye pain, facial edema, fatigue, fever, joint pain, nail changes, rhinorrhea, shortness of breath, sore throat or vomiting. Past treatments include nothing. Her past medical history is significant for allergies and eczema.  Otalgia   There is pain in the right ear. This is a new problem. The current episode started 1 to 4 weeks ago. The problem occurs constantly. The problem has been unchanged. There has been no fever. Associated symptoms include hearing loss and a rash. Pertinent negatives include no abdominal pain, coughing, diarrhea, ear discharge, headaches, neck pain, rhinorrhea, sore throat or vomiting. She has tried nothing for the symptoms. There is no history of a chronic ear infection, hearing loss or a tympanostomy tube.   Amanda Peters is unhappy about my instructions to apply debrox solution to right ear and return to office for irrigation.  I explained that cerumen was dry and impacted; therefore harder to remove without the use of debrox for a few days.  She replied by stating, it was done before without use of debrox solution, hence does not understand why she has to do that now.  I tried to explain again why she needs debrox solution prior to ear irrigation, but she proceeded to say she wasted her time coming to office today. Also said she can not hear out of right ear and is itching due to hair dye exposure. I stated that her skin rash will be addressed today, and it was separate issue  from cerumen impaction. I also stated that her ear will be addressed as well but needs the drops to facilitate cerumen removal. She repeated that she feels like she wasted her time today and wanted her ear irrigated today.  Outpatient Medications Prior to Visit  Medication Sig Dispense Refill  . acetaminophen (TYLENOL) 500 MG tablet Take 1,000 mg by mouth every 6 (six) hours as needed for moderate pain or headache.    . desonide (DESOWEN) 0.05 % cream Apply 1 application topically 2 (two) times daily.     Marland Kitchen EPIDUO FORTE 0.3-2.5 % GEL Apply 1 application topically at bedtime.    Marland Kitchen HUMIRA PEN 40 MG/0.8ML PNKT Inject 40 mg as directed every 14 (fourteen) days.     Marland Kitchen ibuprofen (ADVIL,MOTRIN) 800 MG tablet Take 1 tablet (800 mg total) by mouth every 8 (eight) hours as needed. 30 tablet 5  . phentermine 37.5 MG capsule Take 1 capsule (37.5 mg total) by mouth every morning. 30 capsule 2  . SUMAtriptan (IMITREX) 100 MG tablet Take 1 tablet (100 mg total) by mouth every 2 (two) hours as needed for migraine. May repeat in 2 hours if headache persists or recurs. 10 tablet 5  . Prenat w/o A-FeCbn-Meth-FA-DHA (PRENATE MINI) 29-0.6-0.4-350 MG CAPS Take 1 capsule by mouth daily before breakfast. (Patient not taking: Reported on 02/28/2017) 30 capsule 11  . Tetrahydrozoline HCl (REDNESS RELIEVER EYE DROPS OP) Apply 1 drop to eye daily as needed (red eyes).  No facility-administered medications prior to visit.     ROS See HPI  Objective:  BP 122/86   Pulse 75   Temp 98.8 F (37.1 C)   Ht 5' 2.25" (1.581 m)   Wt 184 lb (83.5 kg)   SpO2 97%   BMI 33.38 kg/m   BP Readings from Last 3 Encounters:  02/28/17 122/86  01/24/17 118/76  01/02/17 121/75    Wt Readings from Last 3 Encounters:  02/28/17 184 lb (83.5 kg)  01/24/17 183 lb 11.2 oz (83.3 kg)  01/02/17 185 lb (83.9 kg)    Physical Exam  Constitutional: No distress.  HENT:  Right Ear: External ear normal. No drainage. No mastoid  tenderness.  Left Ear: External ear and ear canal normal. No swelling. No mastoid tenderness.  No middle ear effusion.  Unable assess right TM and ear canal due to cerumen impaction.  Scaly macular rash on external ear.  Eyes: Conjunctivae, EOM and lids are normal. Pupils are equal, round, and reactive to light. Right eye exhibits no chemosis and no discharge. Left eye exhibits no chemosis and no discharge.  Cardiovascular: Normal rate.   Pulmonary/Chest: Effort normal.  Musculoskeletal: She exhibits no edema.  Skin: Rash noted. Rash is maculopapular. No erythema.  Scaly maculopapular rash on scalp, bilateral upper extremities and torso.  Vitals reviewed.   Lab Results  Component Value Date   WBC 4.8 10/23/2016   HGB 11.8 (L) 10/23/2016   HCT 35.3 (L) 10/23/2016   PLT 388 10/23/2016   GLUCOSE 105 (H) 10/21/2016   CHOL 204 (H) 01/21/2016   TRIG 73.0 01/21/2016   HDL 69.70 01/21/2016   LDLCALC 120 (H) 01/21/2016   ALT 19 01/21/2016   AST 22 01/21/2016   NA 136 10/21/2016   K 3.7 10/21/2016   CL 102 10/21/2016   CREATININE 0.90 10/21/2016   BUN 9 10/21/2016   CO2 26 10/21/2016   TSH 2.07 01/21/2016    No results found.  Assessment & Plan:   Amanda Peters was seen today for allergic reaction.  Diagnoses and all orders for this visit:  Impacted cerumen of right ear -     carbamide peroxide (DEBROX) 6.5 % OTIC solution; Place 5 drops into the right ear 2 (two) times daily.  Dermatitis -     methylPREDNISolone acetate (DEPO-MEDROL) injection 40 mg; Inject 1 mL (40 mg total) into the muscle once.   I am having Amanda Peters start on carbamide peroxide. I am also having her maintain her desonide, SUMAtriptan, EPIDUO FORTE, phentermine, HUMIRA PEN, acetaminophen, Tetrahydrozoline HCl (REDNESS RELIEVER EYE DROPS OP), ibuprofen, and PRENATE MINI. We administered methylPREDNISolone acetate.  Meds ordered this encounter  Medications  . carbamide peroxide (DEBROX) 6.5 % OTIC solution      Sig: Place 5 drops into the right ear 2 (two) times daily.    Dispense:  15 mL    Refill:  0    Order Specific Question:   Supervising Provider    Answer:   Tresa GarterPLOTNIKOV, ALEKSEI V [[id number]]  . methylPREDNISolone acetate (DEPO-MEDROL) injection 40 mg    Follow-up: Return in about 3 days (around 03/03/2017), or if symptoms worsen or fail to improve, for right ear irrigation.  Alysia Pennaharlotte Lars Jeziorski, NP

## 2017-02-28 NOTE — Patient Instructions (Addendum)
I believe rash is an exacerbation of psoriasis due to contact with chemical irritant.  Follow up with dermatology if no improvement in 3days.  Use benadryl OTC  As needed for itching.  Use desonide cream for topical rash.  Avoid known skin irritants.  Return to office after 3days of deborx ear drop in right ear.  Ms. Valentina LucksGriffin is unhappy about my instructions to apply debrox solution to right ear and return to office for irrigation.  I explained that cerumen was dry and impacted; therefore harder to remove without the use of debrox for a few days.  She replied by stating, it was done before without use of debrox solution, hence does not understand why she has to do that now.  I tried to explain again why she need debrox solution prior to ear irrigation, but she proceeded to say she wasted her time coming to office today. Also said she can not hear out of right ear and is itching due to hair dye exposure. I stated that her skin rash will be addressed today, and it was separate issue from cerumen impaction. I also stated that her ear will be addressed as well but needs the drops to facilitate cerumen removal. She repeated that she feels like she wasted her time today and wanted her ear irrigated today.

## 2017-04-18 ENCOUNTER — Telehealth: Payer: Self-pay

## 2017-04-18 DIAGNOSIS — N898 Other specified noninflammatory disorders of vagina: Secondary | ICD-10-CM

## 2017-04-18 MED ORDER — FLUCONAZOLE 150 MG PO TABS: 150.0000 mg | ORAL_TABLET | Freq: Once | ORAL | 0 refills | Status: AC

## 2017-04-18 NOTE — Telephone Encounter (Signed)
S/w patient and she stated that she is having white discharge and itching and would like a prescription sent in for yeast, sent rx per provider approval.

## 2017-05-03 ENCOUNTER — Ambulatory Visit: Payer: BLUE CROSS/BLUE SHIELD | Admitting: Obstetrics

## 2017-05-11 ENCOUNTER — Ambulatory Visit: Payer: BLUE CROSS/BLUE SHIELD | Admitting: Obstetrics

## 2017-06-14 ENCOUNTER — Encounter: Payer: Self-pay | Admitting: *Deleted

## 2017-06-14 ENCOUNTER — Ambulatory Visit (INDEPENDENT_AMBULATORY_CARE_PROVIDER_SITE_OTHER): Payer: Self-pay

## 2017-06-14 VITALS — BP 118/77 | HR 97 | Wt 187.0 lb

## 2017-06-14 DIAGNOSIS — Z3200 Encounter for pregnancy test, result unknown: Secondary | ICD-10-CM

## 2017-06-14 DIAGNOSIS — Z3201 Encounter for pregnancy test, result positive: Secondary | ICD-10-CM

## 2017-06-14 LAB — POCT URINE PREGNANCY: PREG TEST UR: POSITIVE — AB

## 2017-06-14 NOTE — Progress Notes (Signed)
Patient is here for pregnancy test. Patient was advised test results were positive and EDD: 02/08/18. Patient was advised to schedule her initial OB visit as well.

## 2017-06-26 ENCOUNTER — Encounter (HOSPITAL_COMMUNITY): Payer: Self-pay

## 2017-06-26 ENCOUNTER — Inpatient Hospital Stay (HOSPITAL_COMMUNITY)
Admission: AD | Admit: 2017-06-26 | Discharge: 2017-06-26 | Disposition: A | Discharge status: Home / Self Care | Payer: 59 | Source: Ambulatory Visit | Attending: Obstetrics & Gynecology | Admitting: Obstetrics & Gynecology

## 2017-06-26 ENCOUNTER — Inpatient Hospital Stay (HOSPITAL_COMMUNITY): Payer: 59

## 2017-06-26 DIAGNOSIS — O23592 Infection of other part of genital tract in pregnancy, second trimester: Secondary | ICD-10-CM | POA: Diagnosis not present

## 2017-06-26 DIAGNOSIS — Z9889 Other specified postprocedural states: Secondary | ICD-10-CM | POA: Insufficient documentation

## 2017-06-26 DIAGNOSIS — O99210 Obesity complicating pregnancy, unspecified trimester: Secondary | ICD-10-CM | POA: Diagnosis present

## 2017-06-26 DIAGNOSIS — N760 Acute vaginitis: Secondary | ICD-10-CM

## 2017-06-26 DIAGNOSIS — Z8249 Family history of ischemic heart disease and other diseases of the circulatory system: Secondary | ICD-10-CM | POA: Diagnosis not present

## 2017-06-26 DIAGNOSIS — O209 Hemorrhage in early pregnancy, unspecified: Secondary | ICD-10-CM | POA: Diagnosis not present

## 2017-06-26 DIAGNOSIS — Z79899 Other long term (current) drug therapy: Secondary | ICD-10-CM | POA: Diagnosis not present

## 2017-06-26 DIAGNOSIS — O26891 Other specified pregnancy related conditions, first trimester: Secondary | ICD-10-CM | POA: Insufficient documentation

## 2017-06-26 DIAGNOSIS — Z87891 Personal history of nicotine dependence: Secondary | ICD-10-CM | POA: Insufficient documentation

## 2017-06-26 DIAGNOSIS — O26899 Other specified pregnancy related conditions, unspecified trimester: Secondary | ICD-10-CM

## 2017-06-26 DIAGNOSIS — Z791 Long term (current) use of non-steroidal anti-inflammatories (NSAID): Secondary | ICD-10-CM | POA: Diagnosis not present

## 2017-06-26 DIAGNOSIS — Z3A01 Less than 8 weeks gestation of pregnancy: Secondary | ICD-10-CM | POA: Diagnosis not present

## 2017-06-26 DIAGNOSIS — Z3490 Encounter for supervision of normal pregnancy, unspecified, unspecified trimester: Secondary | ICD-10-CM

## 2017-06-26 DIAGNOSIS — Z8349 Family history of other endocrine, nutritional and metabolic diseases: Secondary | ICD-10-CM | POA: Diagnosis not present

## 2017-06-26 DIAGNOSIS — O99711 Diseases of the skin and subcutaneous tissue complicating pregnancy, first trimester: Secondary | ICD-10-CM | POA: Diagnosis not present

## 2017-06-26 DIAGNOSIS — L409 Psoriasis, unspecified: Secondary | ICD-10-CM | POA: Insufficient documentation

## 2017-06-26 DIAGNOSIS — R109 Unspecified abdominal pain: Secondary | ICD-10-CM | POA: Diagnosis not present

## 2017-06-26 DIAGNOSIS — B9689 Other specified bacterial agents as the cause of diseases classified elsewhere: Secondary | ICD-10-CM | POA: Diagnosis present

## 2017-06-26 LAB — URINALYSIS, ROUTINE W REFLEX MICROSCOPIC
Bacteria, UA: NONE SEEN
Bilirubin Urine: NEGATIVE
GLUCOSE, UA: NEGATIVE mg/dL
Ketones, ur: NEGATIVE mg/dL
Leukocytes, UA: NEGATIVE
NITRITE: NEGATIVE
PH: 6 (ref 5.0–8.0)
Protein, ur: NEGATIVE mg/dL
SPECIFIC GRAVITY, URINE: 1.023 (ref 1.005–1.030)

## 2017-06-26 LAB — CBC
HEMATOCRIT: 35.3 % — AB (ref 36.0–46.0)
HEMOGLOBIN: 12.1 g/dL (ref 12.0–15.0)
MCH: 29.7 pg (ref 26.0–34.0)
MCHC: 34.3 g/dL (ref 30.0–36.0)
MCV: 86.7 fL (ref 78.0–100.0)
Platelets: 272 10*3/uL (ref 150–400)
RBC: 4.07 MIL/uL (ref 3.87–5.11)
RDW: 12.2 % (ref 11.5–15.5)
WBC: 3.5 10*3/uL — ABNORMAL LOW (ref 4.0–10.5)

## 2017-06-26 LAB — WET PREP, GENITAL
Sperm: NONE SEEN
TRICH WET PREP: NONE SEEN
YEAST WET PREP: NONE SEEN

## 2017-06-26 LAB — HCG, QUANTITATIVE, PREGNANCY: hCG, Beta Chain, Quant, S: 100051 m[IU]/mL — ABNORMAL HIGH (ref <5)

## 2017-06-26 LAB — ABO/RH: ABO/RH(D): B POS

## 2017-06-26 MED ORDER — METRONIDAZOLE 500 MG PO TABS: 500.0000 mg | ORAL_TABLET | Freq: Two times a day (BID) | ORAL | 0 refills | Status: AC

## 2017-06-26 MED ORDER — METOCLOPRAMIDE HCL 10 MG PO TABS: 10.0000 mg | ORAL_TABLET | Freq: Four times a day (QID) | ORAL | 0 refills | Status: DC

## 2017-06-26 MED ORDER — PRENATAL COMPLETE 14-0.4 MG PO TABS: 1.0000 | ORAL_TABLET | Freq: Every day | ORAL | 12 refills | Status: DC

## 2017-06-26 MED ORDER — PROMETHAZINE HCL 12.5 MG PO TABS: 12.5000 mg | ORAL_TABLET | Freq: Four times a day (QID) | ORAL | 0 refills | Status: DC | PRN

## 2017-06-26 NOTE — MAU Provider Note (Signed)
History     CSN: [id number]  Arrival date and time: 06/26/17 4098   First Provider Initiated Contact with Patient 06/26/17 0809      Chief Complaint  Patient presents with  . Vaginal Bleeding   HPI  [name] is a 31 y.o. G2P0010 at [redacted]w[redacted]d gestation by LMP presenting to MAU with complaints of abdominal cramping and pink spotting when she went to the BR this morning. She reports having abdominal cramping like her period was about to begin right when she found out she was pregnant. The pink spotting is a new issue. She has no other complaints.  Past Medical History:  Diagnosis Date  . Breast abscess    left  . Headache    migraines  . Psoriasis     Past Surgical History:  Procedure Laterality Date  . BREAST SURGERY    . NO PAST SURGERIES      Family History  Problem Relation Age of Onset  . Hypothyroidism Mother   . Hypertension Father     Social History   Tobacco Use  . Smoking status: Former Smoker    Types: Cigarettes  . Smokeless tobacco: Never Used  Substance Use Topics  . Alcohol use: Yes    Alcohol/week: 0.0 oz    Comment: Ocassionally  . Drug use: No    Comment: has not in 6 months    Allergies: No Known Allergies  Medications Prior to Admission  Medication Sig Dispense Refill Last Dose  . acetaminophen (TYLENOL) 500 MG tablet Take 1,000 mg by mouth every 6 (six) hours as needed for moderate pain or headache.   Taking  . EPIDUO FORTE 0.3-2.5 % GEL Apply 1 application topically at bedtime.   Taking  . ibuprofen (ADVIL,MOTRIN) 800 MG tablet Take 1 tablet (800 mg total) by mouth every 8 (eight) hours as needed. 30 tablet 5 Taking  . Tetrahydrozoline HCl (REDNESS RELIEVER EYE DROPS OP) Apply 1 drop to eye daily as needed (red eyes).   Not Taking    Review of Systems  Constitutional: Negative.   HENT: Negative.   Eyes: Negative.   Respiratory: Negative.   Cardiovascular: Negative.   Gastrointestinal: Negative.   Endocrine: Negative.    Genitourinary: Positive for pelvic pain (lower cramping) and vaginal bleeding (pink spotting).  Musculoskeletal: Negative.   Skin: Negative.   Allergic/Immunologic: Negative.   Neurological: Negative.   Hematological: Negative.   Psychiatric/Behavioral: Negative.    Physical Exam   Blood pressure 117/68, pulse 75, temperature 98.1 F (36.7 C), resp. rate 18, weight 186 lb (84.4 kg), last menstrual period 05/04/2017.  Physical Exam  Nursing note and vitals reviewed. Constitutional: She is oriented to person, place, and time. She appears well-developed and well-nourished.  HENT:  Head: Normocephalic.  Eyes: Pupils are equal, round, and reactive to light.  Neck: Normal range of motion.  Cardiovascular: Normal rate, regular rhythm, normal heart sounds and intact distal pulses.  Respiratory: Effort normal and breath sounds normal.  GI: Soft. Bowel sounds are normal.  Genitourinary:  Genitourinary Comments: Uterus: enlarged, cx: smooth, pink, no lesions, small amt of thick, pinkish red vaginal d/c, closed/long/firm, no CMT or friability, no adnexal tenderness   Musculoskeletal: Normal range of motion.  Neurological: She is alert and oriented to person, place, and time. She has normal reflexes.  Skin: Skin is warm and dry.  Psychiatric: She has a normal mood and affect. Her behavior is normal. Judgment and thought content normal.    MAU Course  Procedures  MDM CBC HCG Wet Prep GC/CT HIV ABO/Rh OB <14 wks U/S OB < 14 wks TVUS  Results for orders placed or performed during the hospital encounter of 06/26/17 (from the past 24 hour(s))  Urinalysis, Routine w reflex microscopic     Status: Abnormal   Collection Time: 06/26/17  7:20 AM  Result Value Ref Range   Color, Urine YELLOW YELLOW   APPearance CLEAR CLEAR   Specific Gravity, Urine 1.023 1.005 - 1.030   pH 6.0 5.0 - 8.0   Glucose, UA NEGATIVE NEGATIVE mg/dL   Hgb urine dipstick SMALL (A) NEGATIVE   Bilirubin Urine  NEGATIVE NEGATIVE   Ketones, ur NEGATIVE NEGATIVE mg/dL   Protein, ur NEGATIVE NEGATIVE mg/dL   Nitrite NEGATIVE NEGATIVE   Leukocytes, UA NEGATIVE NEGATIVE   RBC / HPF 0-5 0 - 5 RBC/hpf   WBC, UA 0-5 0 - 5 WBC/hpf   Bacteria, UA NONE SEEN NONE SEEN   Squamous Epithelial / LPF 0-5 (A) NONE SEEN   Mucus PRESENT   Wet prep, genital     Status: Abnormal   Collection Time: 06/26/17  8:19 AM  Result Value Ref Range   Yeast Wet Prep HPF POC NONE SEEN NONE SEEN   Trich, Wet Prep NONE SEEN NONE SEEN   Clue Cells Wet Prep HPF POC PRESENT (A) NONE SEEN   WBC, Wet Prep HPF POC MODERATE (A) NONE SEEN   Sperm NONE SEEN   CBC     Status: Abnormal   Collection Time: 06/26/17  8:35 AM  Result Value Ref Range   WBC 3.5 (L) 4.0 - 10.5 K/uL   RBC 4.07 3.87 - 5.11 MIL/uL   Hemoglobin 12.1 12.0 - 15.0 g/dL   HCT 16.1 (L) 09.6 - 04.5 %   MCV 86.7 78.0 - 100.0 fL   MCH 29.7 26.0 - 34.0 pg   MCHC 34.3 30.0 - 36.0 g/dL   RDW 40.9 81.1 - 91.4 %   Platelets 272 150 - 400 K/uL  ABO/Rh     Status: None (Preliminary result)   Collection Time: 06/26/17  8:35 AM  Result Value Ref Range   ABO/RH(D) B POS    US Ob Comp Less 14 Wks  Result Date: 06/26/2017 CLINICAL DATA:  Pelvic pain and vaginal bleeding beginning this morning. Gestational age by LMP of 7 weeks 4 days. EXAM: OBSTETRIC <14 WK [place] AND TRANSVAGINAL OB US TECHNIQUE: Both transabdominal and transvaginal ultrasound examinations were performed for complete evaluation of the gestation as well as the maternal uterus, adnexal regions, and pelvic cul-de-sac. Transvaginal technique was performed to assess early pregnancy. COMPARISON:  None. FINDINGS: Intrauterine gestational sac: Single Yolk sac:  Visualized. Embryo:  Visualized. Cardiac Activity: Visualized. Heart Rate: 178  bpm CRL:  18  mm   8 w   1 d                  [place] EDC: 02/04/2018 Subchorionic hemorrhage:  None visualized. Maternal uterus/adnexae: Normal appearance of both ovaries. No adnexal mass or  abnormal free fluid identified . IMPRESSION: Single living IUP measuring 8 weeks 1 day, which concordant with LMP. No significant maternal uterine or adnexal abnormality identified. Electronically Signed   By: Myles Rosenthal M.D.   On: 06/26/2017 09:24   US Ob Transvaginal  Result Date: 06/26/2017 CLINICAL DATA:  Pelvic pain and vaginal bleeding beginning this morning. Gestational age by LMP of 7 weeks 4 days. EXAM: OBSTETRIC <14 WK [place] AND TRANSVAGINAL OB US  TECHNIQUE: Both transabdominal and transvaginal ultrasound examinations were performed for complete evaluation of the gestation as well as the maternal uterus, adnexal regions, and pelvic cul-de-sac. Transvaginal technique was performed to assess early pregnancy. COMPARISON:  None. FINDINGS: Intrauterine gestational sac: Single Yolk sac:  Visualized. Embryo:  Visualized. Cardiac Activity: Visualized. Heart Rate: 178  bpm CRL:  18  mm   8 w   1 d                  US EDC: 02/04/2018 Subchorionic hemorrhage:  None visualized. Maternal uterus/adnexae: Normal appearance of both ovaries. No adnexal mass or abnormal free fluid identified . IMPRESSION: Single living IUP measuring 8 weeks 1 day, which concordant with LMP. No significant maternal uterine or adnexal abnormality identified. Electronically Signed   By: Myles RosenthalJohn  Stahl M.D.   On: 06/26/2017 09:24    Assessment and Plan  Bleeding in early pregnancy - Advised to return to MAU for BRB like a period - Information provided on VB in pregnancy   Abdominal cramping affecting pregnancy - Take Tylenol 1000 mg po q 6 hrs prn pain - Stay well-hydrated  Intrauterine pregnancy - Information provided on 1st trimester pregnancy   Bacterial vaginitis - Rx for Flagyl 500 mg po BID x 7 days - Information provided on BV and Flagyl  Discharge home Patient verbalized an understanding of the plan of care and agrees.   Raelyn Moraolitta Skarlette Lattner, MSN, CNM 06/26/2017, 8:30 AM

## 2017-06-26 NOTE — Discharge Instructions (Signed)
You may take Tylenol 1000 mg by mouth every 6 hrs as needed for pain  [facility]    [facility]       Phone: [phone]  Center for [facility] [place] Phone: [phone]  [facility] [place]  Phone: [phone]  [facility] for [facility]  Phone: [phone]  [facility] for [facility] [place]  Phone: [phone]  [facility]       Phone: [phone]  [facility]    Phone: [phone]   [facility]    Phone: [phone]  [facility]    Phone: [phone]  [facility]    Phone: [phone]  [facility]    Phone: [phone]  [facility]       Phone: [phone]   [facility]  Phone: [phone]  [facility]    Phone: [phone]  [facility]   Phone: [phone]  [facility]      Phone: [phone]  [facility]    Phone: [phone]

## 2017-06-26 NOTE — MAU Note (Signed)
Pt presents to MAU with complaints of mild lower abdominal cramping with pink vaginal discharge when she wipes that she noticed this morning

## 2017-06-26 NOTE — MAU Note (Signed)
Pt presents with c/o spotting that began this morning.  States having mild menstrual type cramps, but not hurting.

## 2017-06-27 LAB — GC/CHLAMYDIA PROBE AMP (~~LOC~~) NOT AT ARMC
CHLAMYDIA, DNA PROBE: NEGATIVE
NEISSERIA GONORRHEA: NEGATIVE

## 2017-06-27 LAB — HIV ANTIBODY (ROUTINE TESTING W REFLEX): HIV Screen 4th Generation wRfx: NONREACTIVE

## 2017-07-07 ENCOUNTER — Inpatient Hospital Stay (HOSPITAL_COMMUNITY): Payer: Medicaid Other

## 2017-07-07 ENCOUNTER — Encounter (HOSPITAL_COMMUNITY): Payer: Self-pay | Admitting: *Deleted

## 2017-07-07 ENCOUNTER — Inpatient Hospital Stay (HOSPITAL_COMMUNITY)
Admission: AD | Admit: 2017-07-07 | Discharge: 2017-07-07 | Disposition: A | Discharge status: Home / Self Care | Payer: Medicaid Other | Source: Ambulatory Visit | Attending: Obstetrics and Gynecology | Admitting: Obstetrics and Gynecology

## 2017-07-07 ENCOUNTER — Other Ambulatory Visit: Payer: Self-pay

## 2017-07-07 DIAGNOSIS — O209 Hemorrhage in early pregnancy, unspecified: Secondary | ICD-10-CM

## 2017-07-07 DIAGNOSIS — Z79899 Other long term (current) drug therapy: Secondary | ICD-10-CM | POA: Diagnosis not present

## 2017-07-07 DIAGNOSIS — Z87891 Personal history of nicotine dependence: Secondary | ICD-10-CM | POA: Insufficient documentation

## 2017-07-07 DIAGNOSIS — Z3A09 9 weeks gestation of pregnancy: Secondary | ICD-10-CM | POA: Diagnosis not present

## 2017-07-07 DIAGNOSIS — Z3490 Encounter for supervision of normal pregnancy, unspecified, unspecified trimester: Secondary | ICD-10-CM

## 2017-07-07 HISTORY — DX: Chlamydial infection, unspecified: A74.9

## 2017-07-07 HISTORY — DX: Unspecified infectious disease: B99.9

## 2017-07-07 LAB — WET PREP, GENITAL
CLUE CELLS WET PREP: NONE SEEN
Sperm: NONE SEEN
Trich, Wet Prep: NONE SEEN
Yeast Wet Prep HPF POC: NONE SEEN

## 2017-07-07 LAB — URINALYSIS, ROUTINE W REFLEX MICROSCOPIC
Bacteria, UA: NONE SEEN
Bilirubin Urine: NEGATIVE
GLUCOSE, UA: NEGATIVE mg/dL
Ketones, ur: NEGATIVE mg/dL
Leukocytes, UA: NEGATIVE
Nitrite: NEGATIVE
PH: 7 (ref 5.0–8.0)
PROTEIN: NEGATIVE mg/dL
SPECIFIC GRAVITY, URINE: 1.006 (ref 1.005–1.030)

## 2017-07-07 NOTE — MAU Note (Signed)
Had been having brown spotting last couple days.  Felt some wetness today, when went to bathroom, noted heavier bleeding, brownish, turning red. No pain.

## 2017-07-07 NOTE — MAU Provider Note (Signed)
Chief Complaint: Vaginal Bleeding   First Provider Initiated Contact with Patient 07/07/17 1457      SUBJECTIVE HPI: Amanda Peters is a [age] y.o. G2P0010 at 6320w1d by LMP who presents to maternity admissions reporting spotting/light vaginal bleeding starting today.  The bleeding is dark red/brown, noted only when wiping, and not requiring a pad. She has not tried any treatments, nothing makes the bleeding better or worse.  There are no other associated symptoms. She denies vaginal bleeding, vaginal itching/burning, urinary symptoms, h/a, dizziness, n/v, or fever/chills.     HPI  Past Medical History:  Diagnosis Date  . Breast abscess    left  . Chlamydia   . Headache    migraines  . Infection    UTI  . Psoriasis   . Psoriasis    Past Surgical History:  Procedure Laterality Date  . BREAST SURGERY Left   . INCISION AND DRAINAGE LEFT BREAST ABSCESS Left 10/17/2016   Performed by Griselda Mineroth, Paul III, [facility] at [facility]  . NO PAST SURGERIES     Social History   Socioeconomic History  . Marital status: Single    Spouse name: Not on file  . Number of children: Not on file  . Years of education: Not on file  . Highest education level: Not on file  Social Needs  . Financial resource strain: Not on file  . Food insecurity - worry: Not on file  . Food insecurity - inability: Not on file  . Transportation needs - medical: Not on file  . Transportation needs - non-medical: Not on file  Occupational History  . Not on file  Tobacco Use  . Smoking status: Former Smoker    Types: Cigarettes  . Smokeless tobacco: Never Used  Substance and Sexual Activity  . Alcohol use: Yes    Alcohol/week: 0.0 oz    Comment: Ocassionally  . Drug use: No    Comment: has not in 6 months  . Sexual activity: Yes    Partners: Male    Birth control/protection: None  Other Topics Concern  . Not on file  Social History Narrative  . Not on file   No current facility-administered medications on file prior to  encounter.    Current Outpatient Medications on File Prior to Encounter  Medication Sig Dispense Refill  . promethazine (PHENERGAN) 12.5 MG tablet Take 1 tablet (12.5 mg total) every 6 (six) hours as needed by mouth for nausea or vomiting. 30 tablet 0  . acetaminophen (TYLENOL) 500 MG tablet Take 1,000 mg by mouth every 6 (six) hours as needed for moderate pain or headache.    . Prenatal Vit-Fe Fumarate-FA (PRENATAL COMPLETE) 14-0.4 MG TABS Take 1 tablet daily by mouth. (Patient not taking: Reported on 07/07/2017) 30 each 12   No Known Allergies  ROS:  Review of Systems  Constitutional: Negative for chills, fatigue and fever.  Respiratory: Negative for shortness of breath.   Cardiovascular: Negative for chest pain.  Genitourinary: Positive for vaginal bleeding. Negative for difficulty urinating, dysuria, flank pain, pelvic pain, vaginal discharge and vaginal pain.  Neurological: Negative for dizziness and headaches.  Psychiatric/Behavioral: Negative.      I have reviewed patient's Past Medical Hx, Surgical Hx, Family Hx, Social Hx, medications and allergies.   Physical Exam   Patient Vitals for the past 24 hrs:  BP Temp Temp src Pulse Resp SpO2 Weight  07/07/17 1746 114/71 99.2 F (37.3 C) Oral 77 17 - -  07/07/17 1401 114/71 98.4 F (  36.9 C) Oral 87 18 100 % 184 lb (83.5 kg)   Constitutional: Well-developed, well-nourished female in no acute distress.  Cardiovascular: normal rate Respiratory: normal effort GI: Abd soft, non-tender. Pos BS x 4 MS: Extremities nontender, no edema, normal ROM Neurologic: Alert and oriented x 4.  GU: Neg CVAT.  PELVIC EXAM: RN collected vaginal swabs  Bedside US with visible heartbeat but unable to measure/record so pt sent for formal US  LAB RESULTS Results for orders placed or performed during the hospital encounter of 07/07/17 (from the past 24 hour(s))  Urinalysis, Routine w reflex microscopic     Status: Abnormal   Collection Time:  07/07/17  2:06 PM  Result Value Ref Range   Color, Urine STRAW (A) YELLOW   APPearance CLEAR CLEAR   Specific Gravity, Urine 1.006 1.005 - 1.030   pH 7.0 5.0 - 8.0   Glucose, UA NEGATIVE NEGATIVE mg/dL   Hgb urine dipstick MODERATE (A) NEGATIVE   Bilirubin Urine NEGATIVE NEGATIVE   Ketones, ur NEGATIVE NEGATIVE mg/dL   Protein, ur NEGATIVE NEGATIVE mg/dL   Nitrite NEGATIVE NEGATIVE   Leukocytes, UA NEGATIVE NEGATIVE   RBC / HPF 0-5 0 - 5 RBC/hpf   WBC, UA 0-5 0 - 5 WBC/hpf   Bacteria, UA NONE SEEN NONE SEEN   Squamous Epithelial / LPF 0-5 (A) NONE SEEN  Wet prep, genital     Status: Abnormal   Collection Time: 07/07/17  3:32 PM  Result Value Ref Range   Yeast Wet Prep HPF POC NONE SEEN NONE SEEN   Trich, Wet Prep NONE SEEN NONE SEEN   Clue Cells Wet Prep HPF POC NONE SEEN NONE SEEN   WBC, Wet Prep HPF POC MANY (A) NONE SEEN   Sperm NONE SEEN     --/--/B POS (11/05 0835)  IMAGING [place] Ob Comp Less 14 Wks  Result Date: 07/07/2017 CLINICAL DATA:  Vaginal bleeding and first-trimester pregnancy EXAM: OBSTETRIC <14 WK ULTRASOUND TECHNIQUE: Transabdominal ultrasound was performed for evaluation of the gestation as well as the maternal uterus and adnexal regions. COMPARISON:  06/26/2017 FINDINGS: Intrauterine gestational sac: Single Yolk sac:  Visualized. Embryo:  Visualized. Cardiac Activity: Visualized. Heart Rate: 173 bpm CRL: 29.1 Mm 9 w 5 d US EDC: 02/04/2018 -there has been expected growth since prior Subchorionic hemorrhage:  None visualized. Maternal uterus/adnexae: Focal thick appearance of the myometrial wall on transverse images is not reproduced on sagittal images and is likely from imaging angle. No fibroid seen on prior. Normal adnexae. IMPRESSION: Single living intrauterine pregnancy measuring 9 weeks 5 days. Expected growth since study 06/26/2017. No adverse finding. Electronically Signed   By: Marnee SpringJonathon  Watts M.D.   On: 07/07/2017 16:04   [place] Ob Comp Less 14 Wks  Result  Date: 06/26/2017 CLINICAL DATA:  Pelvic pain and vaginal bleeding beginning this morning. Gestational age by LMP of 7 weeks 4 days. EXAM: OBSTETRIC <14 WK US AND TRANSVAGINAL OB US TECHNIQUE: Both transabdominal and transvaginal ultrasound examinations were performed for complete evaluation of the gestation as well as the maternal uterus, adnexal regions, and pelvic cul-de-sac. Transvaginal technique was performed to assess early pregnancy. COMPARISON:  None. FINDINGS: Intrauterine gestational sac: Single Yolk sac:  Visualized. Embryo:  Visualized. Cardiac Activity: Visualized. Heart Rate: 178  bpm CRL:  18  mm   8 w   1 d                  US EDC: 02/04/2018 Subchorionic hemorrhage:  None visualized.  Maternal uterus/adnexae: Normal appearance of both ovaries. No adnexal mass or abnormal free fluid identified . IMPRESSION: Single living IUP measuring 8 weeks 1 day, which concordant with LMP. No significant maternal uterine or adnexal abnormality identified. Electronically Signed   By: Myles Rosenthal M.D.   On: 06/26/2017 09:24   US Ob Transvaginal  Result Date: 06/26/2017 CLINICAL DATA:  Pelvic pain and vaginal bleeding beginning this morning. Gestational age by LMP of 7 weeks 4 days. EXAM: OBSTETRIC <14 WK [place] AND TRANSVAGINAL OB US TECHNIQUE: Both transabdominal and transvaginal ultrasound examinations were performed for complete evaluation of the gestation as well as the maternal uterus, adnexal regions, and pelvic cul-de-sac. Transvaginal technique was performed to assess early pregnancy. COMPARISON:  None. FINDINGS: Intrauterine gestational sac: Single Yolk sac:  Visualized. Embryo:  Visualized. Cardiac Activity: Visualized. Heart Rate: 178  bpm CRL:  18  mm   8 w   1 d                  [place] EDC: 02/04/2018 Subchorionic hemorrhage:  None visualized. Maternal uterus/adnexae: Normal appearance of both ovaries. No adnexal mass or abnormal free fluid identified . IMPRESSION: Single living IUP measuring 8 weeks 1 day,  which concordant with LMP. No significant maternal uterine or adnexal abnormality identified. Electronically Signed   By: Myles Rosenthal M.D.   On: 06/26/2017 09:24    MAU Management/MDM: Ordered labs and reviewed results.  IUP with normal FHR on [place].  No known cause for bleeding.  Consult Dr Dareen Piano with assessment and findings.  D/C home, follow up in office as scheduled, return to MAU with heavy bleeding or other emergencies.  Pt discharged with strict bleeding precautions.  ASSESSMENT 1. Bleeding in early pregnancy   2. Intrauterine pregnancy   3. Vaginal bleeding in pregnancy, first trimester     PLAN Discharge home Allergies as of 07/07/2017   No Known Allergies     Medication List    STOP taking these medications   ibuprofen 800 MG tablet Commonly known as:  ADVIL,MOTRIN   metoCLOPramide 10 MG tablet Commonly known as:  REGLAN     TAKE these medications   acetaminophen 500 MG tablet Commonly known as:  TYLENOL Take 1,000 mg by mouth every 6 (six) hours as needed for moderate pain or headache.   PRENATAL COMPLETE 14-0.4 MG Tabs Take 1 tablet daily by mouth.   promethazine 12.5 MG tablet Commonly known as:  PHENERGAN Take 1 tablet (12.5 mg total) every 6 (six) hours as needed by mouth for nausea or vomiting.      Follow-up Information    Levi Aland, MD Follow up.   Specialty:  Obstetrics and Gynecology Why:  As scheduled, return to MAU as needed for emergencies Contact information: [address] [place] [address] [phone]           Sharen Counter Certified Nurse-Midwife 07/07/2017  7:48 PM

## 2017-07-10 LAB — GC/CHLAMYDIA PROBE AMP (~~LOC~~) NOT AT ARMC
Chlamydia: NEGATIVE
NEISSERIA GONORRHEA: NEGATIVE

## 2017-07-20 ENCOUNTER — Encounter: Payer: Self-pay | Admitting: Certified Nurse Midwife

## 2017-07-20 ENCOUNTER — Ambulatory Visit (INDEPENDENT_AMBULATORY_CARE_PROVIDER_SITE_OTHER): Payer: 59 | Admitting: Certified Nurse Midwife

## 2017-07-20 VITALS — BP 112/71 | HR 86 | Wt 183.0 lb

## 2017-07-20 DIAGNOSIS — Z113 Encounter for screening for infections with a predominantly sexual mode of transmission: Secondary | ICD-10-CM

## 2017-07-20 DIAGNOSIS — Z3480 Encounter for supervision of other normal pregnancy, unspecified trimester: Secondary | ICD-10-CM

## 2017-07-20 NOTE — Addendum Note (Signed)
Addended by: Marya LandryFOSTER, Catlin Aycock D on: 07/20/2017 04:14 PM   Modules accepted: Orders

## 2017-07-20 NOTE — Progress Notes (Signed)
Pt states she did have some spotting a few days ago, ?related to intercourse. [facility]  Pt states she has had some nausea, doing better now. Can't really eat certain foods. Pt feels that she has a yeast infection, would like check today.

## 2017-07-20 NOTE — Progress Notes (Signed)
Subjective:   Amanda Peters is a 31 y.o. G2P0010 at 6469w0d by LMP, early ultrasound being seen today for her first obstetrical visit.  Her obstetrical history is significant for obesity. Patient does intend to breast feed. Pregnancy history fully reviewed.  Patient reports nausea, no bleeding, no contractions, no cramping, no leaking and vaginal irritation.  HISTORY: Obstetric History   G2   P0   T0   P0   A1   L0    SAB1   TAB0   Ectopic0   Multiple0   Live Births0     # Outcome Date GA Lbr Len/2nd Weight Sex Delivery Anes PTL Lv  2 Current           1 SAB 10/20/16 10881w1d            Past Medical History:  Diagnosis Date  . Breast abscess    left  . Chlamydia   . Headache    migraines  . Infection    UTI  . Psoriasis   . Psoriasis    Past Surgical History:  Procedure Laterality Date  . BREAST SURGERY Left   . INCISION AND DRAINAGE ABSCESS Left 10/17/2016   Procedure: INCISION AND DRAINAGE LEFT BREAST ABSCESS;  Surgeon: Chevis PrettyPaul Toth III, MD;  Location: [facility] OR;  Service: General;  Laterality: Left;  . NO PAST SURGERIES     Family History  Problem Relation Age of Onset  . Hypothyroidism Mother   . Hypertension Mother   . Hypertension Father    Social History   Tobacco Use  . Smoking status: Former Smoker    Types: Cigarettes  . Smokeless tobacco: Never Used  Substance Use Topics  . Alcohol use: No    Alcohol/week: 0.0 oz    Frequency: Never    Comment: Ocassionally  . Drug use: No    Comment: has not in 6 months   No Known Allergies Current Outpatient Medications on File Prior to Visit  Medication Sig Dispense Refill  . acetaminophen (TYLENOL) 500 MG tablet Take 1,000 mg by mouth every 6 (six) hours as needed for moderate pain or headache.     No current facility-administered medications on file prior to visit.      Exam   Vitals:   07/20/17 1027  BP: 112/71  Pulse: 86  Weight: 183 lb (83 kg)   Fetal Heart Rate (bpm): 165; doppler  Uterus:       Pelvic Exam: Perineum: no hemorrhoids, normal perineum   Vulva: normal external genitalia, no lesions   Vagina:  normal mucosa, normal discharge   Cervix: no lesions and normal, pap smear done.    Adnexa: normal adnexa and no mass, fullness, tenderness   Bony Pelvis: average  System: General: well-developed, well-nourished female in no acute distress   Breast:  normal appearance, no masses or tenderness   Skin: normal coloration and turgor, no rashes   Neurologic: oriented, normal, negative, normal mood   Extremities: normal strength, tone, and muscle mass, ROM of all joints is normal   HEENT PERRLA, extraocular movement intact and sclera clear, anicteric   Mouth/Teeth mucous membranes moist, pharynx normal without lesions and dental hygiene good   Neck supple and no masses   Cardiovascular: regular rate and rhythm   Respiratory:  no respiratory distress, normal breath sounds   Abdomen: soft, non-tender; bowel sounds normal; no masses,  no organomegaly     Assessment:   Pregnancy: G2P0010 Patient Active Problem List  Diagnosis Date Noted  . Supervision of other normal pregnancy, antepartum 07/20/2017  . Bleeding in early pregnancy 06/26/2017  . Abdominal cramping affecting pregnancy 06/26/2017  . Intrauterine pregnancy 06/26/2017  . Bacterial vaginitis 06/26/2017  . Pain in joint, shoulder region 08/23/2016  . Migraines 01/21/2016  . Dermatitis 10/28/2013  . Obesity 10/28/2013     Plan:  1. Supervision of other normal pregnancy, antepartum     - Culture, OB Urine - Hemoglobinopathy evaluation - Obstetric Panel, Including HIV - Varicella zoster antibody, IgG - VITAMIN D 25 Hydroxy (Vit-D Deficiency, Fractures) - Hemoglobin A1c - Inheritest Core(CF97,SMA,FraX) - MaterniT21 PLUS Core+SCA   Initial labs drawn. Continue prenatal vitamins. Genetic Screening discussed, NIPS: ordered. Ultrasound discussed; fetal anatomic survey: ordered. Problem list reviewed and  updated. The nature of [facility] with multiple MDs and other Advanced Practice Providers was explained to patient; also emphasized that residents, students are part of our team. Routine obstetric precautions reviewed. Return in about 4 weeks (around 08/17/2017) for ROB.     Orvilla Cornwallachelle Denney, CNM [facility]

## 2017-07-21 LAB — CERVICOVAGINAL ANCILLARY ONLY
BACTERIAL VAGINITIS: POSITIVE — AB
CANDIDA VAGINITIS: POSITIVE — AB
Chlamydia: NEGATIVE
NEISSERIA GONORRHEA: NEGATIVE
TRICH (WINDOWPATH): NEGATIVE

## 2017-07-21 LAB — VITAMIN D 25 HYDROXY (VIT D DEFICIENCY, FRACTURES): Vit D, 25-Hydroxy: 15 ng/mL — ABNORMAL LOW (ref 30.0–100.0)

## 2017-07-22 ENCOUNTER — Other Ambulatory Visit: Payer: Self-pay | Admitting: Certified Nurse Midwife

## 2017-07-22 DIAGNOSIS — B373 Candidiasis of vulva and vagina: Secondary | ICD-10-CM

## 2017-07-22 DIAGNOSIS — E559 Vitamin D deficiency, unspecified: Secondary | ICD-10-CM

## 2017-07-22 DIAGNOSIS — N760 Acute vaginitis: Principal | ICD-10-CM

## 2017-07-22 DIAGNOSIS — B9689 Other specified bacterial agents as the cause of diseases classified elsewhere: Secondary | ICD-10-CM

## 2017-07-22 DIAGNOSIS — B3731 Acute candidiasis of vulva and vagina: Secondary | ICD-10-CM

## 2017-07-22 MED ORDER — METRONIDAZOLE 0.75 % VA GEL: 1.0000 | Freq: Two times a day (BID) | VAGINAL | 0 refills | Status: DC

## 2017-07-22 MED ORDER — TERCONAZOLE 0.8 % VA CREA: 1.0000 | TOPICAL_CREAM | Freq: Every day | VAGINAL | 0 refills | Status: DC

## 2017-07-22 MED ORDER — VITAMIN D (ERGOCALCIFEROL) 1.25 MG (50000 UNIT) PO CAPS: 50000.0000 [IU] | ORAL_CAPSULE | ORAL | 2 refills | Status: DC

## 2017-07-24 LAB — OBSTETRIC PANEL, INCLUDING HIV
Antibody Screen: NEGATIVE
BASOS: 0 %
Basophils Absolute: 0 10*3/uL (ref 0.0–0.2)
EOS (ABSOLUTE): 0.1 10*3/uL (ref 0.0–0.4)
EOS: 1 %
HEMATOCRIT: 37.2 % (ref 34.0–46.6)
HEMOGLOBIN: 12.3 g/dL (ref 11.1–15.9)
HIV SCREEN 4TH GENERATION: NONREACTIVE
Hepatitis B Surface Ag: NEGATIVE
IMMATURE GRANS (ABS): 0 10*3/uL (ref 0.0–0.1)
Immature Granulocytes: 0 %
LYMPHS: 34 %
Lymphocytes Absolute: 1.8 10*3/uL (ref 0.7–3.1)
MCH: 29.4 pg (ref 26.6–33.0)
MCHC: 33.1 g/dL (ref 31.5–35.7)
MCV: 89 fL (ref 79–97)
MONOS ABS: 0.4 10*3/uL (ref 0.1–0.9)
Monocytes: 7 %
NEUTROS ABS: 3.2 10*3/uL (ref 1.4–7.0)
Neutrophils: 58 %
Platelets: 268 10*3/uL (ref 150–379)
RBC: 4.19 x10E6/uL (ref 3.77–5.28)
RDW: 13.1 % (ref 12.3–15.4)
RPR Ser Ql: NONREACTIVE
Rh Factor: POSITIVE
Rubella Antibodies, IGG: 2.76 index (ref >0.99)
WBC: 5.5 10*3/uL (ref 3.4–10.8)

## 2017-07-24 LAB — HEMOGLOBIN A1C
Est. average glucose Bld gHb Est-mCnc: 120 mg/dL
HEMOGLOBIN A1C: 5.8 % — AB (ref 4.8–5.6)

## 2017-07-24 LAB — URINE CULTURE, OB REFLEX

## 2017-07-24 LAB — HEMOGLOBINOPATHY EVALUATION
HEMOGLOBIN F QUANTITATION: 0 % (ref 0.0–2.0)
HGB A: 97.5 % (ref 96.4–98.8)
HGB C: 0 %
HGB S: 0 %
HGB VARIANT: 0 %
Hemoglobin A2 Quantitation: 2.5 % (ref 1.8–3.2)

## 2017-07-24 LAB — CULTURE, OB URINE

## 2017-07-24 LAB — VARICELLA ZOSTER ANTIBODY, IGG: VARICELLA: 3617 {index} (ref >165)

## 2017-07-25 ENCOUNTER — Other Ambulatory Visit: Payer: Self-pay | Admitting: Certified Nurse Midwife

## 2017-07-25 ENCOUNTER — Encounter: Payer: Self-pay | Admitting: Obstetrics & Gynecology

## 2017-07-26 ENCOUNTER — Other Ambulatory Visit: Payer: Self-pay | Admitting: Certified Nurse Midwife

## 2017-07-26 DIAGNOSIS — B951 Streptococcus, group B, as the cause of diseases classified elsewhere: Secondary | ICD-10-CM

## 2017-07-26 DIAGNOSIS — Z3480 Encounter for supervision of other normal pregnancy, unspecified trimester: Secondary | ICD-10-CM

## 2017-07-26 DIAGNOSIS — R7309 Other abnormal glucose: Secondary | ICD-10-CM

## 2017-07-26 DIAGNOSIS — O2341 Unspecified infection of urinary tract in pregnancy, first trimester: Secondary | ICD-10-CM

## 2017-07-26 LAB — MATERNIT21 PLUS CORE+SCA
CHROMOSOME 13: NEGATIVE
CHROMOSOME 18: NEGATIVE
CHROMOSOME 21: NEGATIVE
Y CHROMOSOME: DETECTED

## 2017-07-26 MED ORDER — NITROFURANTOIN MONOHYD MACRO 100 MG PO CAPS: 100.0000 mg | ORAL_CAPSULE | Freq: Two times a day (BID) | ORAL | 0 refills | Status: DC

## 2017-07-28 ENCOUNTER — Other Ambulatory Visit: Payer: Self-pay | Admitting: Certified Nurse Midwife

## 2017-07-28 DIAGNOSIS — Z3480 Encounter for supervision of other normal pregnancy, unspecified trimester: Secondary | ICD-10-CM

## 2017-07-29 LAB — INHERITEST CORE(CF97,SMA,FRAX)

## 2017-08-01 ENCOUNTER — Other Ambulatory Visit: Payer: Self-pay | Admitting: Certified Nurse Midwife

## 2017-08-01 DIAGNOSIS — Z3480 Encounter for supervision of other normal pregnancy, unspecified trimester: Secondary | ICD-10-CM

## 2017-08-07 ENCOUNTER — Other Ambulatory Visit: Payer: Self-pay | Admitting: Certified Nurse Midwife

## 2017-08-10 ENCOUNTER — Other Ambulatory Visit: Payer: 59

## 2017-08-10 DIAGNOSIS — Z3480 Encounter for supervision of other normal pregnancy, unspecified trimester: Secondary | ICD-10-CM

## 2017-08-11 LAB — GLUCOSE TOLERANCE, 2 HOURS W/ 1HR
GLUCOSE, 1 HOUR: 116 mg/dL (ref 65–179)
GLUCOSE, 2 HOUR: 45 mg/dL — AB (ref 65–152)
Glucose, Fasting: 81 mg/dL (ref 65–91)

## 2017-08-17 ENCOUNTER — Ambulatory Visit (INDEPENDENT_AMBULATORY_CARE_PROVIDER_SITE_OTHER): Payer: 59

## 2017-08-17 VITALS — BP 100/65 | HR 80 | Wt 182.7 lb

## 2017-08-17 DIAGNOSIS — Z3482 Encounter for supervision of other normal pregnancy, second trimester: Secondary | ICD-10-CM

## 2017-08-17 DIAGNOSIS — B373 Candidiasis of vulva and vagina: Secondary | ICD-10-CM

## 2017-08-17 DIAGNOSIS — B3731 Acute candidiasis of vulva and vagina: Secondary | ICD-10-CM

## 2017-08-17 DIAGNOSIS — N898 Other specified noninflammatory disorders of vagina: Secondary | ICD-10-CM

## 2017-08-17 DIAGNOSIS — Z3480 Encounter for supervision of other normal pregnancy, unspecified trimester: Secondary | ICD-10-CM

## 2017-08-17 MED ORDER — TERCONAZOLE 0.4 % VA CREA: 1.0000 | TOPICAL_CREAM | Freq: Every day | VAGINAL | 0 refills | Status: DC

## 2017-08-17 NOTE — Progress Notes (Signed)
   PRENATAL VISIT NOTE  Subjective:  Amanda Peters is a 31 y.o. G2P0010 at 6567w0d being seen today for ongoing prenatal care.  She is currently monitored for the following issues for this low-risk pregnancy and has Dermatitis; Obesity; Migraines; Pain in joint, shoulder region; Bleeding in early pregnancy; Maternal obesity, antepartum; Intrauterine pregnancy; Bacterial vaginitis; Supervision of other normal pregnancy, antepartum; Vitamin D deficiency; Positive GBS test; and Elevated hemoglobin A1c on their problem list.  Patient reports vaginal irritation. She states her vagina is itching and has a thick white vaginal discharge. Contractions: Not present. Vag. Bleeding: None.   . Denies leaking of fluid.   The following portions of the patient's history were reviewed and updated as appropriate: allergies, current medications, past family history, past medical history, past social history, past surgical history and problem list. Problem list updated.  Objective:   Vitals:   08/17/17 1506  BP: 100/65  Pulse: 80  Weight: 182 lb 11.2 oz (82.9 kg)    Fetal Status: Fetal Heart Rate (bpm): 151         General:  Alert, oriented and cooperative. Patient is in no acute distress.  Skin: Skin is warm and dry. No rash noted.   Cardiovascular: Normal heart rate noted  Respiratory: Normal respiratory effort, no problems with respiration noted  Abdomen: Soft, gravid, appropriate for gestational age.  Pain/Pressure: Absent     Pelvic: Cervical exam deferred        Extremities: Normal range of motion.  Edema: None  Mental Status:  Normal mood and affect. Normal behavior. Normal judgment and thought content.   Assessment and Plan:  Pregnancy: G2P0010 at 2267w0d  1. Supervision of other normal pregnancy, antepartum -Results reviewed from initial prenatal -GBS positive bacteruria- reviewed GBS testing and treatment  2. Yeast infection of the vagina -Wet prep done today. Will treat by presentation. -  terconazole (TERAZOL 7) 0.4 % vaginal cream; Place 1 applicator vaginally at bedtime.  Dispense: 45 g; Refill: 0  Preterm labor symptoms and general obstetric precautions including but not limited to vaginal bleeding, contractions, leaking of fluid and fetal movement were reviewed in detail with the patient. Please refer to After Visit Summary for other counseling recommendations.  Return in about 4 weeks (around 09/14/2017) for Return OB visit.  Rolm BookbinderCaroline M Theoren Palka, CNM 08/17/17 3:33 PM

## 2017-08-17 NOTE — Patient Instructions (Signed)

## 2017-08-18 LAB — CERVICOVAGINAL ANCILLARY ONLY
BACTERIAL VAGINITIS: NEGATIVE
CANDIDA VAGINITIS: POSITIVE — AB

## 2017-08-22 NOTE — L&D Delivery Note (Signed)
Patient complete and pushing. SVD of non-viable female infant over intact perineum. Infant weighed 333 gm and was meconium stained.  Cord clamped x 2 and cut. 1000 mcg of pr Cytotec, IV pitocin bolused in and 40 u Pitocin injected into the cord. Awaiting delivery of placenta. Anesthesia: PCA

## 2017-08-28 ENCOUNTER — Other Ambulatory Visit: Payer: Self-pay | Admitting: Certified Nurse Midwife

## 2017-08-28 DIAGNOSIS — Z3480 Encounter for supervision of other normal pregnancy, unspecified trimester: Secondary | ICD-10-CM

## 2017-09-06 ENCOUNTER — Encounter (HOSPITAL_COMMUNITY): Payer: Self-pay | Admitting: Certified Nurse Midwife

## 2017-09-14 ENCOUNTER — Other Ambulatory Visit: Payer: Self-pay | Admitting: Certified Nurse Midwife

## 2017-09-14 ENCOUNTER — Ambulatory Visit (INDEPENDENT_AMBULATORY_CARE_PROVIDER_SITE_OTHER): Payer: Medicaid Other | Admitting: Certified Nurse Midwife

## 2017-09-14 ENCOUNTER — Ambulatory Visit (HOSPITAL_COMMUNITY)
Admission: RE | Admit: 2017-09-14 | Discharge: 2017-09-14 | Disposition: A | Discharge status: Home / Self Care | Payer: Medicaid Other | Source: Ambulatory Visit | Attending: Certified Nurse Midwife | Admitting: Certified Nurse Midwife

## 2017-09-14 ENCOUNTER — Encounter: Payer: Self-pay | Admitting: Certified Nurse Midwife

## 2017-09-14 VITALS — BP 114/75 | HR 87 | Wt 189.2 lb

## 2017-09-14 DIAGNOSIS — Z363 Encounter for antenatal screening for malformations: Secondary | ICD-10-CM

## 2017-09-14 DIAGNOSIS — O3430 Maternal care for cervical incompetence, unspecified trimester: Secondary | ICD-10-CM | POA: Insufficient documentation

## 2017-09-14 DIAGNOSIS — O99212 Obesity complicating pregnancy, second trimester: Secondary | ICD-10-CM | POA: Diagnosis not present

## 2017-09-14 DIAGNOSIS — O3432 Maternal care for cervical incompetence, second trimester: Secondary | ICD-10-CM

## 2017-09-14 DIAGNOSIS — Z3A19 19 weeks gestation of pregnancy: Secondary | ICD-10-CM | POA: Insufficient documentation

## 2017-09-14 DIAGNOSIS — L409 Psoriasis, unspecified: Secondary | ICD-10-CM | POA: Insufficient documentation

## 2017-09-14 DIAGNOSIS — Z3480 Encounter for supervision of other normal pregnancy, unspecified trimester: Secondary | ICD-10-CM

## 2017-09-14 DIAGNOSIS — B951 Streptococcus, group B, as the cause of diseases classified elsewhere: Secondary | ICD-10-CM

## 2017-09-14 DIAGNOSIS — O26872 Cervical shortening, second trimester: Secondary | ICD-10-CM | POA: Diagnosis not present

## 2017-09-14 DIAGNOSIS — E559 Vitamin D deficiency, unspecified: Secondary | ICD-10-CM

## 2017-09-14 NOTE — Progress Notes (Signed)
Anatomy Scan scheduled today @ 1:15pm.  CC: pt wants some type of relief w/ skin irritation. Unsure of what to use while pregnant.

## 2017-09-14 NOTE — Patient Instructions (Addendum)
Second Trimester of Pregnancy The second trimester is from week 13 through week 28, month 4 through 6. This is often the time in pregnancy that you feel your best. Often times, morning sickness has lessened or quit. You may have more energy, and you may get hungry more often. Your unborn baby (fetus) is growing rapidly. At the end of the sixth month, he or she is about 9 inches long and weighs about 1 pounds. You will likely feel the baby move (quickening) between 18 and 20 weeks of pregnancy. Follow these instructions at home:  Avoid all smoking, herbs, and alcohol. Avoid drugs not approved by your doctor.  Do not use any tobacco products, including cigarettes, chewing tobacco, and electronic cigarettes. If you need help quitting, ask your doctor. You may get counseling or other support to help you quit.  Only take medicine as told by your doctor. Some medicines are safe and some are not during pregnancy.  Exercise only as told by your doctor. Stop exercising if you start having cramps.  Eat regular, healthy meals.  Wear a good support bra if your breasts are tender.  Do not use hot tubs, steam rooms, or saunas.  Wear your seat belt when driving.  Avoid raw meat, uncooked cheese, and liter boxes and soil used by cats.  Take your prenatal vitamins.  Take 1500-2000 milligrams of calcium daily starting at the 20th week of pregnancy until you deliver your baby.  Try taking medicine that helps you poop (stool softener) as needed, and if your doctor approves. Eat more fiber by eating fresh fruit, vegetables, and whole grains. Drink enough fluids to keep your pee (urine) clear or pale yellow.  Take warm water baths (sitz baths) to soothe pain or discomfort caused by hemorrhoids. Use hemorrhoid cream if your doctor approves.  If you have puffy, bulging veins (varicose veins), wear support hose. Raise (elevate) your feet for 15 minutes, 3-4 times a day. Limit salt in your diet.  Avoid heavy  lifting, wear low heals, and sit up straight.  Rest with your legs raised if you have leg cramps or low back pain.  Visit your dentist if you have not gone during your pregnancy. Use a soft toothbrush to brush your teeth. Be gentle when you floss.  You can have sex (intercourse) unless your doctor tells you not to.  Go to your doctor visits. Get help if:  You feel dizzy.  You have mild cramps or pressure in your lower belly (abdomen).  You have a nagging pain in your belly area.  You continue to feel sick to your stomach (nauseous), throw up (vomit), or have watery poop (diarrhea).  You have bad smelling fluid coming from your vagina.  You have pain with peeing (urination). Get help right away if:  You have a fever.  You are leaking fluid from your vagina.  You have spotting or bleeding from your vagina.  You have severe belly cramping or pain.  You lose or gain weight rapidly.  You have trouble catching your breath and have chest pain.  You notice sudden or extreme puffiness (swelling) of your face, hands, ankles, feet, or legs.  You have not felt the baby move in over an hour.  You have severe headaches that do not go away with medicine.  You have vision changes. This information is not intended to replace advice given to you by your health care provider. Make sure you discuss any questions you have with your health care   provider. Document Released: 11/02/2009 Document Revised: 01/14/2016 Document Reviewed: 10/09/2012 Elsevier Interactive Patient Education  2017 Elsevier Inc.  

## 2017-09-14 NOTE — Progress Notes (Addendum)
   PRENATAL VISIT NOTE  Subjective:  Amanda Peters is a 32 y.o. G2P0010 at 1252w0d being seen today for ongoing prenatal care.  She is currently monitored for the following issues for this low-risk pregnancy and has Dermatitis; Obesity; Migraines; Pain in joint, shoulder region; Bleeding in early pregnancy; Maternal obesity, antepartum; Intrauterine pregnancy; Bacterial vaginitis; Supervision of other normal pregnancy, antepartum; Vitamin D deficiency; Positive GBS test; and Elevated hemoglobin A1c on their problem list.  Patient reports no bleeding, no contractions, no cramping, no leaking and psoriasis flare up.  Contractions: Not present. Vag. Bleeding: None.  Movement: Present. Denies leaking of fluid.   The following portions of the patient's history were reviewed and updated as appropriate: allergies, current medications, past family history, past medical history, past social history, past surgical history and problem list. Problem list updated.  Objective:   Vitals:   09/14/17 1131  BP: 114/75  Pulse: 87  Weight: 189 lb 3.2 oz (85.8 kg)    Fetal Status: Fetal Heart Rate (bpm): 152; doppler Fundal Height: 19 cm Movement: Present     General:  Alert, oriented and cooperative. Patient is in no acute distress.  Skin: Skin is warm and dry. No rash noted.   Cardiovascular: Normal heart rate noted  Respiratory: Normal respiratory effort, no problems with respiration noted  Abdomen: Soft, gravid, appropriate for gestational age.  Pain/Pressure: Absent     Pelvic: Cervical exam deferred        Extremities: Normal range of motion.  Edema: None  Mental Status:  Normal mood and affect. Normal behavior. Normal judgment and thought content.   Assessment and Plan:  Pregnancy: G2P0010 at 7452w0d  1. Supervision of other normal pregnancy, antepartum      Doing well.  Reports increased issues with Psoriasis.  Has anatomy scan scheduled for today. - AFP, Serum, Open Spina Bifida  2. Vitamin D  deficiency     Taking weekly vitamin D  3. Positive GBS test     PCN for labor/delivery  Preterm labor symptoms and general obstetric precautions including but not limited to vaginal bleeding, contractions, leaking of fluid and fetal movement were reviewed in detail with the patient. Please refer to After Visit Summary for other counseling recommendations.  Return in about 4 weeks (around 10/12/2017) for ROB.   Roe Coombsachelle A Shelita Steptoe, CNM

## 2017-09-16 ENCOUNTER — Encounter (HOSPITAL_COMMUNITY): Payer: Self-pay | Admitting: *Deleted

## 2017-09-16 ENCOUNTER — Inpatient Hospital Stay (HOSPITAL_COMMUNITY)
Admission: AD | Admit: 2017-09-16 | Discharge: 2017-09-16 | Disposition: A | Discharge status: Home / Self Care | Payer: Medicaid Other | Source: Ambulatory Visit | Attending: Obstetrics and Gynecology | Admitting: Obstetrics and Gynecology

## 2017-09-16 DIAGNOSIS — Z87891 Personal history of nicotine dependence: Secondary | ICD-10-CM | POA: Diagnosis not present

## 2017-09-16 DIAGNOSIS — Z3A19 19 weeks gestation of pregnancy: Secondary | ICD-10-CM | POA: Diagnosis not present

## 2017-09-16 DIAGNOSIS — Z3490 Encounter for supervision of normal pregnancy, unspecified, unspecified trimester: Secondary | ICD-10-CM

## 2017-09-16 DIAGNOSIS — O42912 Preterm premature rupture of membranes, unspecified as to length of time between rupture and onset of labor, second trimester: Secondary | ICD-10-CM | POA: Insufficient documentation

## 2017-09-16 DIAGNOSIS — O26892 Other specified pregnancy related conditions, second trimester: Secondary | ICD-10-CM | POA: Diagnosis present

## 2017-09-16 DIAGNOSIS — B9689 Other specified bacterial agents as the cause of diseases classified elsewhere: Secondary | ICD-10-CM

## 2017-09-16 DIAGNOSIS — O42919 Preterm premature rupture of membranes, unspecified as to length of time between rupture and onset of labor, unspecified trimester: Secondary | ICD-10-CM

## 2017-09-16 DIAGNOSIS — Z3480 Encounter for supervision of other normal pregnancy, unspecified trimester: Secondary | ICD-10-CM

## 2017-09-16 DIAGNOSIS — O99210 Obesity complicating pregnancy, unspecified trimester: Secondary | ICD-10-CM

## 2017-09-16 DIAGNOSIS — O209 Hemorrhage in early pregnancy, unspecified: Secondary | ICD-10-CM | POA: Diagnosis not present

## 2017-09-16 DIAGNOSIS — O99212 Obesity complicating pregnancy, second trimester: Secondary | ICD-10-CM | POA: Insufficient documentation

## 2017-09-16 DIAGNOSIS — O23592 Infection of other part of genital tract in pregnancy, second trimester: Secondary | ICD-10-CM | POA: Diagnosis not present

## 2017-09-16 DIAGNOSIS — E669 Obesity, unspecified: Secondary | ICD-10-CM | POA: Insufficient documentation

## 2017-09-16 DIAGNOSIS — N760 Acute vaginitis: Secondary | ICD-10-CM

## 2017-09-16 LAB — AMNISURE RUPTURE OF MEMBRANE (ROM) NOT AT ARMC: Amnisure ROM: POSITIVE

## 2017-09-16 MED ORDER — ZOLPIDEM TARTRATE 5 MG PO TABS
5.00 | ORAL_TABLET | ORAL | Status: DC
Start: ? — End: 2017-09-16

## 2017-09-16 MED ORDER — LACTATED RINGERS IV SOLN
INTRAVENOUS | Status: DC
Start: ? — End: 2017-09-16

## 2017-09-16 MED ORDER — FAMOTIDINE 20 MG PO TABS
20.00 | ORAL_TABLET | ORAL | Status: DC
Start: ? — End: 2017-09-16

## 2017-09-16 MED ORDER — ACETAMINOPHEN 325 MG PO TABS
650.00 | ORAL_TABLET | ORAL | Status: DC
Start: ? — End: 2017-09-16

## 2017-09-16 MED ORDER — DOCUSATE SODIUM 100 MG PO CAPS
100.00 | ORAL_CAPSULE | ORAL | Status: DC
Start: 2017-09-15 — End: 2017-09-16

## 2017-09-16 MED ORDER — BENZOCAINE-MENTHOL 15-3.6 MG MT LOZG
LOZENGE | OROMUCOSAL | Status: DC
Start: ? — End: 2017-09-16

## 2017-09-16 MED ORDER — GUAIFENESIN 100 MG/5ML PO LIQD
200.00 | ORAL | Status: DC
Start: ? — End: 2017-09-16

## 2017-09-16 MED ORDER — PROMETHAZINE HCL 25 MG/ML IJ SOLN
12.50 | INTRAMUSCULAR | Status: DC
Start: ? — End: 2017-09-16

## 2017-09-16 MED ORDER — PSYLLIUM FIBER 0.52 G PO CAPS
ORAL_CAPSULE | ORAL | Status: DC
Start: ? — End: 2017-09-16

## 2017-09-16 MED ORDER — FAMOTIDINE 20 MG/2ML IV SOLN
20.00 | INTRAVENOUS | Status: DC
Start: ? — End: 2017-09-16

## 2017-09-16 MED ORDER — MAGNESIUM HYDROXIDE 400 MG/5ML PO SUSP
30.00 | ORAL | Status: DC
Start: ? — End: 2017-09-16

## 2017-09-16 MED ORDER — PRENATAL VITAMINS 28-0.8 MG PO TABS
ORAL_TABLET | ORAL | Status: DC
Start: 2017-09-16 — End: 2017-09-16

## 2017-09-16 MED ORDER — LACTATED RINGERS IV SOLN
500.00 | INTRAVENOUS | Status: DC
Start: ? — End: 2017-09-16

## 2017-09-16 NOTE — MAU Note (Signed)
PT  SAYS SHE WAS AT [facility]  YESTERDAY   VE  4  CM.    THEN TONIGHT  AT 0220-   FELT FLUID IN BED  AND ON LEG.

## 2017-09-16 NOTE — MAU Note (Signed)
Leaking a lot of clear fld since 0220. No pain. Cerclage

## 2017-09-16 NOTE — MAU Provider Note (Signed)
History     CSN: [id number]  Arrival date and time: 09/16/17 0241  Chief Complaint  Patient presents with  . Rupture of Membranes   HPI Amanda Peters is a 32 y.o. G2P0010 at 8975w2d who presents complaining of leaking of fluid. She states she woke up at 0220 with clear fluid running down her legs. Denies any pain or bleeding.  At her anatomy scan at 19 weeks, she was found to have no measurable cervix and [facility]. [facility] she states they told her the membranes were through her cervix and she was dilated to 4 cm. She was discharged yesterday afternoon.   OB History    Gravida Para Term Preterm AB Living   2 0 0 0 1 0   SAB TAB Ectopic Multiple Live Births   1 0 0 0 0      Past Medical History:  Diagnosis Date  . Breast abscess    left  . Chlamydia   . Headache    migraines  . Infection    UTI  . Psoriasis   . Psoriasis     Past Surgical History:  Procedure Laterality Date  . BREAST SURGERY Left   . INCISION AND DRAINAGE ABSCESS Left 10/17/2016   Procedure: INCISION AND DRAINAGE LEFT BREAST ABSCESS;  Surgeon: Chevis PrettyPaul Toth III, MD;  Location: [facility] OR;  Service: General;  Laterality: Left;  . NO PAST SURGERIES      Family History  Problem Relation Age of Onset  . Hypothyroidism Mother   . Hypertension Mother   . Hypertension Father     Social History   Tobacco Use  . Smoking status: Former Smoker    Types: Cigarettes  . Smokeless tobacco: Never Used  Substance Use Topics  . Alcohol use: No    Alcohol/week: 0.0 oz    Frequency: Never    Comment: Ocassionally  . Drug use: No    Comment: has not in 6 months    Allergies: No Known Allergies  Medications Prior to Admission  Medication Sig Dispense Refill Last Dose  . acetaminophen (TYLENOL) 500 MG tablet Take 1,000 mg by mouth every 6 (six) hours as needed for moderate pain or headache.   Taking  . terconazole (TERAZOL 7) 0.4 % vaginal cream Place 1  applicator vaginally at bedtime. 45 g 0 Taking  . Vitamin D, Ergocalciferol, (DRISDOL) 50000 units CAPS capsule Take 1 capsule (50,000 Units total) by mouth every 7 (seven) days. 30 capsule 2 Taking    Review of Systems  Constitutional: Negative.  Negative for fatigue and fever.  HENT: Negative.   Respiratory: Negative.  Negative for shortness of breath.   Cardiovascular: Negative.  Negative for chest pain.  Gastrointestinal: Negative.  Negative for abdominal pain, constipation, diarrhea, nausea and vomiting.  Genitourinary: Positive for vaginal discharge. Negative for dysuria.  Neurological: Negative.  Negative for dizziness and headaches.   Physical Exam   Blood pressure 104/64, pulse 91, temperature 98.3 F (36.8 C), temperature source Oral, resp. rate 20, height 5\' 2"  (1.575 m), weight 182 lb (82.6 kg), last menstrual period 05/04/2017.  Physical Exam  Nursing note and vitals reviewed. Constitutional: She is oriented to person, place, and time. She appears well-developed and well-nourished. No distress.  HENT:  Head: Normocephalic.  Eyes: Pupils are equal, round, and reactive to light.  Cardiovascular: Normal rate, regular rhythm and normal heart sounds.  Respiratory: Effort normal and breath sounds normal. No respiratory distress.  GI: Soft. Bowel sounds are normal. She exhibits no distension. There is no tenderness.  Genitourinary:  Genitourinary Comments: Large amount of clear fluid pooling in vagina. Cervix visually 1cm.   Neurological: She is alert and oriented to person, place, and time.  Skin: Skin is warm and dry.  Psychiatric: She has a normal mood and affect. Her behavior is normal. Judgment and thought content normal.   FHT: 153 bpm  MAU Course  Procedures Results for orders placed or performed during the hospital encounter of 09/16/17 (from the past 24 hour(s))  Amnisure rupture of membrane (rom)not at [facility]     Status: None   Collection Time: 09/16/17  2:45 AM   Result Value Ref Range   Amnisure ROM POSITIVE      MDM [name]- positive Amnisure Dr. Jolayne Panther at bedside to discuss options with patient- patient desires to go home  Assessment and Plan   1. Preterm premature rupture of membranes (PPROM) with unknown onset of labor   2. Supervision of other normal pregnancy, antepartum   3. Bleeding in early pregnancy   4. Maternal obesity, antepartum   5. Intrauterine pregnancy   6. Bacterial vaginitis    -Discharge home in stable condition -Signs and symptoms of infection and labor precautions discussed -Patient advised to follow-up with [facility] on Monday for prenatal care -Patient may return to MAU as needed or if her condition were to change or worsen  Rolm Bookbinder CNM 09/16/2017, 3:16 AM

## 2017-09-16 NOTE — Discharge Instructions (Signed)
Premature Rupture and Preterm Premature Rupture of Membranes °A sac made up of membranes surrounds your baby in the womb (uterus). Rupture of membranes is when this sac breaks open. This is also known as your "water breaking." When this sac breaks before labor starts, it is called premature rupture of membranes (PROM). If this happens before 37 weeks of being pregnant, it is called preterm premature rupture of membranes (PPROM). PPROM is serious. It needs medical care right away. °What increases the risk of PPROM? °PPROM is more likely to happen in women who: °· Have an infection. °· Have had PPROM before. °· Have a cervix that is short. °· Have bleeding during the second or third trimester. °· Have a low BMI. This is a measure of body fat. °· Smoke. °· Use drugs. °· Have a low socioeconomic status. ° °What problems can be caused by PROM and PPROM? °This condition creates health dangers for the mother and the baby. These include: °· Giving birth to the baby too early (prematurely). °· Getting a serious infection of the placenta (chorioamnionitis). °· Having the placenta detach from the uterus early (placental abruption). °· Squeezing of the umbilical cord. °· Getting a serious infection after delivery. ° °What are the signs of PROM and PPROM? °· A sudden gush of fluid from the vagina. °· A slow leak of fluid from the vagina. °· Your underwear is wet. °What should I do if I think my water broke? °Call your doctor right away. You will need to go to the hospital to get checked right away. °What happens if I am told that I have PROM or PPROM? °You will have tests done at the hospital. °· If you have PROM, you may be given medicine to start labor (be induced). This may be done if you are not having contractions during the 24 hours after your water broke. °· If you have PPROM and are not having contractions, you may be given medicine to start labor. It will depend on how far along you are in your pregnancy. ° °If you have  PPROM: °· You and your baby will be watched closely to see if you have infections or other problems. °· You may be given: °? An antibiotic medicine. This can stop an infection from starting. °? A steroid medicine. This can help your baby's lungs develop faster. °? A medicine to help prevent cerebral palsy in your baby. °? A medicine to stop early labor (preterm labor). °· You may be told to stay in bed except to use the bathroom (bed rest). °· You may be given medicine to start labor. This may be done if there are problems with you or the baby. ° °Your treatment will depend on many factors. °Contact a doctor if: °· Your water breaks and you are not having contractions. °Get help right away if: °· Your water breaks before you are [redacted] weeks pregnant. °Summary °· When your water breaks before labor starts, it is called premature rupture of membranes (PROM). °· When PROM happens before 37 weeks of pregnancy, it is called preterm premature rupture of membranes (PPROM). °· If you are not having contractions, your labor may be started for you. °This information is not intended to replace advice given to you by your health care provider. Make sure you discuss any questions you have with your health care provider. °Document Released: 11/04/2008 Document Revised: 04/28/2016 Document Reviewed: 04/28/2016 °Elsevier Interactive Patient Education © 2017 Elsevier Inc. ° °

## 2017-09-17 ENCOUNTER — Observation Stay (HOSPITAL_COMMUNITY)
Admission: AD | Admit: 2017-09-17 | Discharge: 2017-09-18 | Disposition: A | Discharge status: Home / Self Care | Payer: Medicaid Other | Source: Ambulatory Visit | Attending: Family Medicine | Admitting: Family Medicine

## 2017-09-17 ENCOUNTER — Other Ambulatory Visit: Payer: Self-pay

## 2017-09-17 ENCOUNTER — Inpatient Hospital Stay (HOSPITAL_COMMUNITY): Payer: Medicaid Other

## 2017-09-17 ENCOUNTER — Encounter (HOSPITAL_COMMUNITY): Payer: Self-pay

## 2017-09-17 DIAGNOSIS — O42919 Preterm premature rupture of membranes, unspecified as to length of time between rupture and onset of labor, unspecified trimester: Secondary | ICD-10-CM

## 2017-09-17 DIAGNOSIS — O021 Missed abortion: Principal | ICD-10-CM | POA: Insufficient documentation

## 2017-09-17 DIAGNOSIS — O42111 Preterm premature rupture of membranes, onset of labor more than 24 hours following rupture, first trimester: Secondary | ICD-10-CM | POA: Diagnosis present

## 2017-09-17 DIAGNOSIS — O3432 Maternal care for cervical incompetence, second trimester: Secondary | ICD-10-CM | POA: Insufficient documentation

## 2017-09-17 DIAGNOSIS — Z3A19 19 weeks gestation of pregnancy: Secondary | ICD-10-CM | POA: Insufficient documentation

## 2017-09-17 DIAGNOSIS — B951 Streptococcus, group B, as the cause of diseases classified elsewhere: Secondary | ICD-10-CM | POA: Diagnosis present

## 2017-09-17 DIAGNOSIS — R109 Unspecified abdominal pain: Secondary | ICD-10-CM | POA: Diagnosis present

## 2017-09-17 DIAGNOSIS — O42112 Preterm premature rupture of membranes, onset of labor more than 24 hours following rupture, second trimester: Secondary | ICD-10-CM | POA: Diagnosis present

## 2017-09-17 DIAGNOSIS — O42012 Preterm premature rupture of membranes, onset of labor within 24 hours of rupture, second trimester: Secondary | ICD-10-CM

## 2017-09-17 DIAGNOSIS — O3430 Maternal care for cervical incompetence, unspecified trimester: Secondary | ICD-10-CM | POA: Diagnosis present

## 2017-09-17 DIAGNOSIS — Z87891 Personal history of nicotine dependence: Secondary | ICD-10-CM | POA: Insufficient documentation

## 2017-09-17 LAB — CBC WITH DIFFERENTIAL/PLATELET
BASOS PCT: 0 %
Basophils Absolute: 0 10*3/uL (ref 0.0–0.1)
Eosinophils Absolute: 0 10*3/uL (ref 0.0–0.7)
Eosinophils Relative: 0 %
HEMATOCRIT: 32.9 % — AB (ref 36.0–46.0)
Hemoglobin: 11.4 g/dL — ABNORMAL LOW (ref 12.0–15.0)
LYMPHS PCT: 9 %
Lymphs Abs: 1.6 10*3/uL (ref 0.7–4.0)
MCH: 30.2 pg (ref 26.0–34.0)
MCHC: 34.7 g/dL (ref 30.0–36.0)
MCV: 87 fL (ref 78.0–100.0)
Monocytes Absolute: 0.7 10*3/uL (ref 0.1–1.0)
Monocytes Relative: 4 %
NEUTROS ABS: 15.8 10*3/uL — AB (ref 1.7–7.7)
NEUTROS PCT: 87 %
Platelets: 234 10*3/uL (ref 150–400)
RBC: 3.78 MIL/uL — AB (ref 3.87–5.11)
RDW: 13.2 % (ref 11.5–15.5)
WBC: 18.2 10*3/uL — AB (ref 4.0–10.5)

## 2017-09-17 LAB — TYPE AND SCREEN
ABO/RH(D): B POS
ANTIBODY SCREEN: NEGATIVE

## 2017-09-17 LAB — WET PREP, GENITAL
SPERM: NONE SEEN
Trich, Wet Prep: NONE SEEN
Yeast Wet Prep HPF POC: NONE SEEN

## 2017-09-17 MED ORDER — ACETAMINOPHEN 325 MG PO TABS: 650.0000 mg | ORAL_TABLET | ORAL | Status: DC | PRN

## 2017-09-17 MED ORDER — ONDANSETRON HCL 4 MG/2ML IJ SOLN: 4.0000 mg | Freq: Four times a day (QID) | INTRAMUSCULAR | Status: DC | PRN

## 2017-09-17 MED ORDER — NALOXONE HCL 0.4 MG/ML IJ SOLN: 0.4000 mg | INTRAMUSCULAR | Status: DC | PRN

## 2017-09-17 MED ORDER — METOCLOPRAMIDE HCL 5 MG/ML IJ SOLN
10.0000 mg | Freq: Once | INTRAMUSCULAR | Status: AC
  Administered 2017-09-17: 10 mg via INTRAVENOUS
  Filled 2017-09-17: qty 2

## 2017-09-17 MED ORDER — PRENATAL MULTIVITAMIN CH: 1.0000 | ORAL_TABLET | Freq: Every day | ORAL | Status: DC

## 2017-09-17 MED ORDER — LACTATED RINGERS IV BOLUS (SEPSIS)
1000.0000 mL | Freq: Once | INTRAVENOUS | Status: AC
  Administered 2017-09-17: 1000 mL via INTRAVENOUS

## 2017-09-17 MED ORDER — HYDROMORPHONE 1 MG/ML IV SOLN
INTRAVENOUS | Status: DC
  Administered 2017-09-17: 23:00:00 via INTRAVENOUS
  Filled 2017-09-17: qty 25

## 2017-09-17 MED ORDER — DIPHENHYDRAMINE HCL 50 MG/ML IJ SOLN
25.0000 mg | Freq: Once | INTRAMUSCULAR | Status: AC
  Administered 2017-09-17: 25 mg via INTRAVENOUS
  Filled 2017-09-17: qty 1

## 2017-09-17 MED ORDER — AMPICILLIN SODIUM 2 G IJ SOLR
2.0000 g | Freq: Four times a day (QID) | INTRAMUSCULAR | Status: DC
  Administered 2017-09-17: 2 g via INTRAVENOUS
  Filled 2017-09-17 (×3): qty 2000

## 2017-09-17 MED ORDER — DIPHENHYDRAMINE HCL 12.5 MG/5ML PO ELIX: 12.5000 mg | ORAL_SOLUTION | Freq: Four times a day (QID) | ORAL | Status: DC | PRN

## 2017-09-17 MED ORDER — ZOLPIDEM TARTRATE 5 MG PO TABS: 5.0000 mg | ORAL_TABLET | Freq: Every evening | ORAL | Status: DC | PRN

## 2017-09-17 MED ORDER — SODIUM CHLORIDE 0.9% FLUSH: 9.0000 mL | INTRAVENOUS | Status: DC | PRN

## 2017-09-17 MED ORDER — DIPHENHYDRAMINE HCL 50 MG/ML IJ SOLN: 12.5000 mg | Freq: Four times a day (QID) | INTRAMUSCULAR | Status: DC | PRN

## 2017-09-17 MED ORDER — DOCUSATE SODIUM 100 MG PO CAPS: 100.0000 mg | ORAL_CAPSULE | Freq: Every day | ORAL | Status: DC

## 2017-09-17 MED ORDER — AMOXICILLIN 500 MG PO CAPS: 500.0000 mg | ORAL_CAPSULE | Freq: Three times a day (TID) | ORAL | Status: DC

## 2017-09-17 MED ORDER — DEXAMETHASONE SODIUM PHOSPHATE 10 MG/ML IJ SOLN
10.0000 mg | Freq: Once | INTRAMUSCULAR | Status: AC
  Administered 2017-09-17: 10 mg via INTRAVENOUS
  Filled 2017-09-17: qty 1

## 2017-09-17 MED ORDER — CALCIUM CARBONATE ANTACID 500 MG PO CHEW: 2.0000 | CHEWABLE_TABLET | ORAL | Status: DC | PRN

## 2017-09-17 MED ORDER — AZITHROMYCIN 250 MG PO TABS
1000.0000 mg | ORAL_TABLET | Freq: Once | ORAL | Status: AC
  Administered 2017-09-17: 1000 mg via ORAL
  Filled 2017-09-17: qty 4

## 2017-09-17 NOTE — Plan of Care (Signed)
Pt. Admitted from MAU. ABX given. Dilaudid PCA started with patient and family education completed (PCA indications, no use of anyone but patient, use of Is). Will continue to monitor patient. Patient and spouse told to call for any changes.

## 2017-09-17 NOTE — H&P (Signed)
Amanda Peters is an 32 y.o. 522P0010 3133w3d female.   Chief Complaint: abdominal cramping HPI: Patient with no measurable cervix. [facility] but was noted to be 4 cm with membranes at the os and discharged. Seen here with previable PPROM on 09/15/17. Returns with increasing pain and feels hot. She is quite uncomfortable. U/s reveals foot through the cervix. Started on vaginal prometrium.  Past Medical History:  Diagnosis Date  . Breast abscess    left  . Chlamydia   . Headache    migraines  . Infection    UTI  . Psoriasis   . Psoriasis     Past Surgical History:  Procedure Laterality Date  . BREAST SURGERY Left   . INCISION AND DRAINAGE ABSCESS Left 10/17/2016   Procedure: INCISION AND DRAINAGE LEFT BREAST ABSCESS;  Surgeon: Chevis PrettyPaul Toth III, MD;  Location: [facility] OR;  Service: General;  Laterality: Left;  . NO PAST SURGERIES      Family History  Problem Relation Age of Onset  . Hypothyroidism Mother   . Hypertension Mother   . Hypertension Father    Social History:  reports that she has quit smoking. Her smoking use included cigarettes. she has never used smokeless tobacco. She reports that she does not drink alcohol or use drugs.   No Known Allergies  Medications Prior to Admission  Medication Sig Dispense Refill  . acetaminophen (TYLENOL) 500 MG tablet Take 1,000 mg by mouth every 6 (six) hours as needed for moderate pain or headache.    . terconazole (TERAZOL 7) 0.4 % vaginal cream Place 1 applicator vaginally at bedtime. 45 g 0  . Vitamin D, Ergocalciferol, (DRISDOL) 50000 units CAPS capsule Take 1 capsule (50,000 Units total) by mouth every 7 (seven) days. 30 capsule 2     A comprehensive review of systems was negative.  Blood pressure 123/70, pulse (!) 116, temperature 99.4 F (37.4 C), temperature source Axillary, resp. rate 18, height 5\' 2"  (1.575 m), weight 183 lb (83 kg), last menstrual period 05/04/2017, SpO2 100  %. General appearance: alert, cooperative, appears stated age and appears uncomfortable Head: Normocephalic, without obvious abnormality, atraumatic Neck: supple, symmetrical, trachea midline Lungs: normal effort Heart: regular rate and rhythm Abdomen: gravid, mildly tender Extremities: Homans sign is negative, no sign of DVT Skin: Skin color, texture, turgor normal. No rashes or lesions Neurologic: Grossly normal   Per CNM Lisa Leftwich-Kirby, something firm is coming though the os. Yeast Wet Prep HPF POC NONE SEEN   Trich, Wet Prep NONE SEEN   Clue Cells Wet Prep HPF POC PRESENT Abnormal    WBC, Wet Prep HPF POC      Assessment/Plan Principal Problem:   Preterm premature rupture of membranes (PPROM) with onset of labor after 24 hours of rupture in second trimester, antepartum Active Problems:   Cervical insufficiency during pregnancy, antepartum  Admission is recommended--seems to be in labor--low grade temp of 99.4 axillary and quite uncomfortable. Offered her cytotec vs. Expectant management--will await her decision. Discussed implications of 2nd trimester loss and next pregnancy and risk of D and C with retained placenta. Abx and Pain control. Expedited delivery if overtly infected.   Reva Boresanya S Pratt 09/17/2017, 10:21 PM

## 2017-09-17 NOTE — MAU Note (Signed)
Pt here with c/o headache, thinks a migraine, and cramping that started a couple of hours ago. Her water broke on Friday and was told to come back if having cramping or headaches.

## 2017-09-17 NOTE — Progress Notes (Addendum)
G2P0 @ [redacted] wksga. Presents to triage for cramps "states "theyre not too strong" pain level 3/10. SROM past Friday and was told to come back if ctx stronger. Denies bleeding.   Hx of migraine  2117: provider at bs assessing. Sterile speculum exam done for cervical dilation.   Doppler 169   2150: Lab and IV done  2227: Reported to 3rd floor RN. Room assigned to 320  2232: Pt to 3rd floor via wheelchair taken by  tech.

## 2017-09-17 NOTE — MAU Provider Note (Signed)
Chief Complaint: Headache and Abdominal Pain   First Provider Initiated Contact with Patient 09/17/17 2034      SUBJECTIVE HPI: Amanda Peters is a 32 y.o. G2P0010 at [redacted]w[redacted]d with short cervix on recent US and PPROM diagnosed on 09/15/17 who presents to maternity admissions reporting onset of cramping abdominal pain today. She reports the pain is mild, 3/10 on pain scale, but it is new today. It is low in her abdomen, intermittent, and has not changed since onset a few hours ago. She has not tried any treatments. She reports a migraine h/a associated with her stress this weekend and pain today. Her h/a started today, is frontal, constant, and associated with light sensitivity.  She usually takes ibuprofen 800 mg but has not taken anything today. She denies vaginal bleeding, vaginal itching/burning, urinary symptoms, h/a, dizziness, n/v, or fever/chills.     HPI  Past Medical History:  Diagnosis Date  . Breast abscess    left  . Chlamydia   . Headache    migraines  . Infection    UTI  . Psoriasis   . Psoriasis    Past Surgical History:  Procedure Laterality Date  . BREAST SURGERY Left   . INCISION AND DRAINAGE ABSCESS Left 10/17/2016   Procedure: INCISION AND DRAINAGE LEFT BREAST ABSCESS;  Surgeon: Chevis Pretty III, MD;  Location: [facility] OR;  Service: General;  Laterality: Left;  . NO PAST SURGERIES     Social History   Socioeconomic History  . Marital status: Single    Spouse name: Not on file  . Number of children: Not on file  . Years of education: Not on file  . Highest education level: Not on file  Social Needs  . Financial resource strain: Not on file  . Food insecurity - worry: Not on file  . Food insecurity - inability: Not on file  . Transportation needs - medical: Not on file  . Transportation needs - non-medical: Not on file  Occupational History  . Not on file  Tobacco Use  . Smoking status: Former Smoker    Types: Cigarettes  . Smokeless tobacco: Never Used   Substance and Sexual Activity  . Alcohol use: No    Alcohol/week: 0.0 oz    Frequency: Never    Comment: Ocassionally  . Drug use: No    Comment: has not in 6 months  . Sexual activity: Yes    Partners: Male    Birth control/protection: None  Other Topics Concern  . Not on file  Social History Narrative  . Not on file   No current facility-administered medications on file prior to encounter.    Current Outpatient Medications on File Prior to Encounter  Medication Sig Dispense Refill  . acetaminophen (TYLENOL) 500 MG tablet Take 1,000 mg by mouth every 6 (six) hours as needed for moderate pain or headache.    . terconazole (TERAZOL 7) 0.4 % vaginal cream Place 1 applicator vaginally at bedtime. 45 g 0  . Vitamin D, Ergocalciferol, (DRISDOL) 50000 units CAPS capsule Take 1 capsule (50,000 Units total) by mouth every 7 (seven) days. 30 capsule 2   No Known Allergies  ROS:  Review of Systems  Constitutional: Negative for chills, fatigue and fever.  Respiratory: Negative for shortness of breath.   Cardiovascular: Negative for chest pain.  Gastrointestinal: Positive for abdominal pain.  Genitourinary: Positive for pelvic pain and vaginal discharge. Negative for difficulty urinating, dysuria, flank pain, vaginal bleeding and vaginal pain.  Neurological: Negative for  dizziness and headaches.  Psychiatric/Behavioral: Negative.      I have reviewed patient's Past Medical Hx, Surgical Hx, Family Hx, Social Hx, medications and allergies.   Physical Exam   Patient Vitals for the past 24 hrs:  BP Temp Temp src Pulse Resp SpO2 Height Weight  09/17/17 2003 (!) 104/57 99.5 F (37.5 C) Oral (!) 112 18 100 % 5\' 2"  (1.575 m) 183 lb (83 kg)   Constitutional: Well-developed, well-nourished female in no acute distress.  Cardiovascular: normal rate Respiratory: normal effort GI: Abd soft, non-tender. Pos BS x 4 MS: Extremities nontender, no edema, normal ROM Neurologic: Alert and  oriented x 4.  GU: Neg CVAT.  PELVIC EXAM: Cervix visually 1 cm, large amount thick mucous/vaginal progesterone from cervical os, ? Hard mass protruding from os SVE with ? Fetal parts through os vs mass/irregularly shaped cervix with firm smooth area felt protruding from os  FHT 167 by doppler  LAB RESULTS No results found for this or any previous visit (from the past 24 hour(s)).  B/Positive/-- (11/29 1345)  IMAGING Limited OB US performed at bedside today in MAU with preliminary report revealing no measurable fluid pockets, footling breech position with foot at/through external os  MAU Management/MDM: Pt with migraine h/a and mild abdominal cramping with new onset today.  Give recent hx of PPROM, SSE performed with difficulty visualizing cervical os and possible hard mass protruding from os.  SVE with similar results, with hard mass protruding from os palpable.  Limited OB US ordered to evaluate cervical length and fetal location.  Results of US c/w exam with likely fetal parts protruding through cervix.  Dr Shawnie PonsPratt to bedside to evaluate/discuss plan of care with pt.   Sharen CounterLisa Leftwich-Kirby Certified Nurse-Midwife 09/17/2017  9:36 PM

## 2017-09-18 ENCOUNTER — Encounter (HOSPITAL_COMMUNITY): Payer: Self-pay | Admitting: Obstetrics and Gynecology

## 2017-09-18 ENCOUNTER — Encounter: Payer: Medicaid Other | Admitting: Obstetrics and Gynecology

## 2017-09-18 DIAGNOSIS — O42012 Preterm premature rupture of membranes, onset of labor within 24 hours of rupture, second trimester: Secondary | ICD-10-CM | POA: Diagnosis not present

## 2017-09-18 LAB — GC/CHLAMYDIA PROBE AMP (~~LOC~~) NOT AT ARMC
Chlamydia: NEGATIVE
NEISSERIA GONORRHEA: NEGATIVE

## 2017-09-18 LAB — RPR: RPR Ser Ql: NONREACTIVE

## 2017-09-18 MED ORDER — DIBUCAINE 1 % RE OINT: 1.0000 "application " | TOPICAL_OINTMENT | RECTAL | Status: DC | PRN

## 2017-09-18 MED ORDER — SIMETHICONE 80 MG PO CHEW: 80.0000 mg | CHEWABLE_TABLET | ORAL | Status: DC | PRN

## 2017-09-18 MED ORDER — COCONUT OIL OIL: 1.0000 "application " | TOPICAL_OIL | Status: DC | PRN

## 2017-09-18 MED ORDER — MISOPROSTOL 200 MCG PO TABS
ORAL_TABLET | ORAL | Status: AC
  Filled 2017-09-18: qty 5

## 2017-09-18 MED ORDER — IBUPROFEN 600 MG PO TABS: 600.0000 mg | ORAL_TABLET | Freq: Four times a day (QID) | ORAL | 0 refills | Status: AC

## 2017-09-18 MED ORDER — ONDANSETRON HCL 4 MG PO TABS: 4.0000 mg | ORAL_TABLET | ORAL | Status: DC | PRN

## 2017-09-18 MED ORDER — SENNOSIDES-DOCUSATE SODIUM 8.6-50 MG PO TABS: 2.0000 | ORAL_TABLET | ORAL | Status: DC

## 2017-09-18 MED ORDER — ACETAMINOPHEN 325 MG PO TABS: 650.0000 mg | ORAL_TABLET | ORAL | Status: DC | PRN

## 2017-09-18 MED ORDER — OXYTOCIN 10 UNIT/ML IJ SOLN
40.0000 [IU] | Freq: Once | INTRAMUSCULAR | Status: AC
  Administered 2017-09-18: 40 [IU] via INTRAMUSCULAR
  Filled 2017-09-18: qty 4

## 2017-09-18 MED ORDER — TETANUS-DIPHTH-ACELL PERTUSSIS 5-2.5-18.5 LF-MCG/0.5 IM SUSP: 0.5000 mL | Freq: Once | INTRAMUSCULAR | Status: DC

## 2017-09-18 MED ORDER — WITCH HAZEL-GLYCERIN EX PADS: 1.0000 "application " | MEDICATED_PAD | CUTANEOUS | Status: DC | PRN

## 2017-09-18 MED ORDER — PRENATAL MULTIVITAMIN CH: 1.0000 | ORAL_TABLET | Freq: Every day | ORAL | Status: DC

## 2017-09-18 MED ORDER — OXYTOCIN 40 UNITS IN LACTATED RINGERS INFUSION - SIMPLE MED: 1.0000 m[IU]/min | INTRAVENOUS | Status: DC

## 2017-09-18 MED ORDER — MISOPROSTOL 200 MCG PO TABS: 1000.0000 ug | ORAL_TABLET | Freq: Once | ORAL | Status: DC

## 2017-09-18 MED ORDER — OXYTOCIN 40 UNITS IN LACTATED RINGERS INFUSION - SIMPLE MED
INTRAVENOUS | Status: AC
  Filled 2017-09-18: qty 1000

## 2017-09-18 MED ORDER — IBUPROFEN 600 MG PO TABS
600.0000 mg | ORAL_TABLET | Freq: Four times a day (QID) | ORAL | Status: DC
  Administered 2017-09-18: 600 mg via ORAL
  Filled 2017-09-18: qty 1

## 2017-09-18 MED ORDER — MEASLES, MUMPS & RUBELLA VAC ~~LOC~~ INJ: 0.5000 mL | INJECTION | Freq: Once | SUBCUTANEOUS | Status: DC

## 2017-09-18 MED ORDER — BENZOCAINE-MENTHOL 20-0.5 % EX AERO: 1.0000 "application " | INHALATION_SPRAY | CUTANEOUS | Status: DC | PRN

## 2017-09-18 MED ORDER — ONDANSETRON HCL 4 MG/2ML IJ SOLN: 4.0000 mg | INTRAMUSCULAR | Status: DC | PRN

## 2017-09-18 MED ORDER — KETOROLAC TROMETHAMINE 30 MG/ML IJ SOLN: 30.0000 mg | Freq: Once | INTRAMUSCULAR | Status: DC

## 2017-09-18 MED ORDER — DIPHENHYDRAMINE HCL 25 MG PO CAPS: 25.0000 mg | ORAL_CAPSULE | Freq: Four times a day (QID) | ORAL | Status: DC | PRN

## 2017-09-18 MED ORDER — ZOLPIDEM TARTRATE 5 MG PO TABS: 5.0000 mg | ORAL_TABLET | Freq: Every evening | ORAL | Status: DC | PRN

## 2017-09-18 NOTE — Discharge Summary (Signed)
OB Discharge Summary  Patient Name: Amanda Peters DOB: [birth year] MRN: [id number]  Date of admission: 09/17/2017 Delivering MD: This patient has no babies on file.  Date of discharge: 09/18/2017  Admitting diagnosis: 19 weeks, headache, cramping, water broke on Friday Intrauterine pregnancy: 5120w4d     Secondary diagnosis:Principal Problem:   Preterm premature rupture of membranes (PPROM) with onset of labor after 24 hours of rupture in second trimester, antepartum Active Problems:   Positive GBS test   Cervical insufficiency during pregnancy, antepartum  Additional problems:None     Discharge diagnosis: Same                                                                     Post partum procedures:none  Augmentation: none  [facility]  Hospital course:  Onset of Labor With Vaginal Delivery     32 y.o. yo G2P0010 at 2820w4d was admitted in Active Labor on 09/17/2017. Patient had an uncomplicated labor course as follows: PPROM occurred 09/15/17. Returned on 1/27 with pain and cramping and noted to have foot in vagina. Placed on OB Specialty care and started on Abx and PCA. Delivered several hours later without intervention.  Lacerations: None Patient had a delivery of a Non Viable infant. Placenta delivered 1 hour later intact [name] had an uncomplicated postpartum course.  She is ambulating, tolerating a regular diet, passing flatus, and urinating well. Patient is discharged home in stable condition on 09/18/17.    Physical exam  Vitals:   09/18/17 0200 09/18/17 0207 09/18/17 0431 09/18/17 0736  BP:  100/60 (!) 90/52 (!) 95/54  Pulse:  (!) 105 96 (!) 105  Resp: 16 16 18 18   Temp:   98.2 F (36.8 C) 98.4 F (36.9 C)  TempSrc:   Oral Oral  SpO2: 98% 98% 96% 99%  Weight:      Height:       General: alert, cooperative and no distress Lochia: appropriate Uterine Fundus: firm DVT Evaluation: No evidence of DVT seen on physical exam. Labs: Lab Results   Component Value Date   WBC 18.2 (H) 09/17/2017   HGB 11.4 (L) 09/17/2017   HCT 32.9 (L) 09/17/2017   MCV 87.0 09/17/2017   PLT 234 09/17/2017   CMP Latest Ref Rng & Units 10/21/2016  Glucose 65 - 99 mg/dL 865(H105(H)  BUN 6 - 20 mg/dL 9  Creatinine 8.460.44 - 9.621.00 mg/dL 9.520.90  Sodium 841135 - 324145 mmol/L 136  Potassium 3.5 - 5.1 mmol/L 3.7  Chloride 101 - 111 mmol/L 102  CO2 22 - 32 mmol/L 26  Calcium 8.9 - 10.3 mg/dL 9.2  Total Protein 6.0 - 8.3 g/dL -  Total Bilirubin 0.2 - 1.2 mg/dL -  Alkaline Phos 39 - 401117 U/L -  AST 0 - 37 U/L -  ALT 0 - 35 U/L -    Discharge instruction: per After Visit Summary  After Visit Meds:  Allergies as of 09/18/2017   No Known Allergies     Medication List    TAKE these medications   acetaminophen 500 MG tablet Commonly known as:  TYLENOL Take 1,000 mg by mouth every 6 (six) hours as needed for moderate pain or headache.   ibuprofen 600 MG tablet Commonly  known as:  ADVIL,MOTRIN Take 1 tablet (600 mg total) by mouth every 6 (six) hours.   terconazole 0.4 % vaginal cream Commonly known as:  TERAZOL 7 Place 1 applicator vaginally at bedtime.   Vitamin D (Ergocalciferol) 50000 units Caps capsule Commonly known as:  DRISDOL Take 1 capsule (50,000 Units total) by mouth every 7 (seven) days.       Diet: routine diet  Activity: Advance as tolerated. Pelvic rest for 2 weeks.   Outpatient follow up:2 weeks Follow up Appt: Future Appointments  Date Time Provider Department Center  09/18/2017  2:45 PM Hermina Staggers, MD [facility] None   Follow up visit: No Follow-up on file.  Postpartum contraception: Not Discussed    09/18/2017 Reva Bores, MD

## 2017-09-18 NOTE — Progress Notes (Addendum)
0115: A non-viable female fetus was born at 80115 AM without the placenta. No RR, no heart beat felt. Small amount of bleeding noted. Dr Shawnie PonsPratt was notified.  0125: Dr Shawnie PonsPratt at bedside.  0135:Oxytocin 40 units/ LR initiated @999 , Cytotec 1000 mg administered vaginal by Dr Shawnie PonsPratt.Marland Kitchen.  0143:Oxytocin 40 Units IV push via placenta x 1 by Dr  Shawnie PonsPratt.  [id number]: Placenta removed with ring forceps by Dr Shawnie PonsPratt. The placenta was noted to be intact. Pt tolerated procedure well.  0300: Pt seems to be having some difficulty looking or holding the baby. We reassured her that it is ok if she is unable to do so. We have taken pictures, feet and hand prints and provided her with a comfort package. Spiritual Care consulted d/t fetal loss.  [id number]: Pastoral Service at bedside.

## 2017-09-18 NOTE — Progress Notes (Signed)
Pt and SO were lying in bed together when I arrived. They presented strength and denied having questions or concerns. Although they did not display emotional grief, they were open to spiritual support. We talked about their loss and, as mentioned in Scripture, that their baby is a gift for heaven and points us to God. After a prayer of comfort, Amanda Peters expressed gratitude and SO was also thankful. Please page if additional support is needed.  Chaplain Marjory Liesamela Hale Chalfin Holder, MDiv   09/18/17 0400  Clinical Encounter Type  Visited With Patient and family together

## 2017-09-18 NOTE — Progress Notes (Signed)
Patient ID: Amanda Peters, female   DOB: [birth year], [age] y.o.   MRN: [id number] Patient placed on bed pan. Speculum placed and placenta visualized. Placenta removed with ring forceps and intact. Minimal bleeding noted.

## 2017-09-18 NOTE — Discharge Instructions (Signed)
Vaginal Delivery, Care After °Refer to this sheet in the next few weeks. These instructions provide you with information on caring for yourself after your delivery. Your health care provider may also give you more specific instructions. Your treatment has been planned according to current medical practices, but problems sometimes occur. Call your health care provider if you have any problems or questions after your delivery. °What to expect after your delivery °After your delivery, it is typical to have the following: °· You may feel pain in the vaginal area for several days after delivery. If you had an incision or a vaginal tear, the area will probably continue to be tender to the touch for several weeks. °· You may feel very fatigued after a vaginal delivery. °· You may have vaginal bleeding and discharge that will start out red, then become pink, then yellow, then white. Altogether, this usually lasts for about 6 weeks. °· The combination of having lost your baby and changing hormones from the delivery can make you feel very sad. You may also experience emotions that change very quickly. Some of the emotions people often notice after loss include: °? Anger. °? Denial. °? Guilt. °? Sorrow. °? Depression. °? Grief. °? Relationship problems. ° °Follow these instructions at home: °· Consider seeking support for your loss. Some forms of support that you might consider include your religious leader, friends, family, a professional counselor, or a bereavement support group. °· Take medicines only as directed by your health care provider. °· Continue to use good perineal care. Good perineal care includes: °? Wiping your perineum from front to back. °? Keeping your perineum clean. °· Do not use tampons or douche until your health care provider says it is okay. °· Shower, wash your hair, and take tub baths as directed by your health care provider. °· Wear a well-fitting bra that provides breast support. °· Drink enough  fluids to keep your urine clear or pale yellow. °· Eat healthy foods. °· Eat high-fiber foods every day, such as whole grain cereals and breads, brown rice, beans, and fresh fruits and vegetables. These foods may help prevent or relieve constipation. °· Follow your health care provider's directions about resuming activities such as climbing stairs, driving, lifting, exercising, or traveling. °· Increase your activities gradually. °· Talk to your health care provider about resuming sexual activities. This depends on your risk of infection, your rate of healing, and your comfort and desire to resume sexual activity. °· Try to have someone help you with your household activities for at least a few days after you leave the hospital. °· Rest as much as possible. °· Keep all of your scheduled postpartum appointments. It is very important to keep your scheduled follow-up appointments. At these appointments, your health care provider will be checking to make sure that you are healing physically and emotionally. °· Do not drink alcohol, especially if you are taking medicine to relieve pain. °· Do not use any tobacco products including cigarettes, chewing tobacco, or electronic cigarettes. If you need help quitting, ask your health care provider. °· Do not use illegal drugs. °Contact a health care provider if: °· You feel sad or depressed. °· You have thoughts of hurting yourself. °· You are having trouble eating or sleeping. °· You cannot enjoy the things in life you have previously enjoyed. °· You are passing large clots from your vagina. Save any clots to show your health care provider. °· You have a bad smelling discharge from your vagina. °·   You have trouble urinating.  You are urinating frequently.  You have pain when you urinate.  You have a change in your bowel movements.  You have increasing redness, pain, or swelling near your incision or vaginal tear.  You have pus draining from your incision or vaginal  tear.  Your incision or vaginal tear is separating.  You have painful, hard, or reddened breasts.  You have a severe headache.  You have blurred vision or see spots.  You are dizzy or light-headed.  You have a rash.  You have nausea or vomiting.  You have not had a menstrual period by the 12th week after delivery.  You have a fever. Get help right away if:  You are concerned that you may hurt yourself or you are considering suicide.  You have persistent pain.  You have chest pain.  You have shortness of breath.  You faint.  You have leg pain.  You have stomach pain.  Your vaginal bleeding saturates two or more sanitary pads in 1 hour. This information is not intended to replace advice given to you by your health care provider. Make sure you discuss any questions you have with your health care provider. Document Released: 12/23/2013 Document Revised: 01/14/2016 Document Reviewed: 09/26/2013 Elsevier Interactive Patient Education  2018 [employer] Postpartum Care After Vaginal Delivery The period of time right after you deliver your newborn is called the postpartum period. What kind of medical care will I receive?  You may continue to receive fluids and medicines through an IV tube inserted into one of your veins.  If an incision was made near your vagina (episiotomy) or if you had some vaginal tearing during delivery, cold compresses may be placed on your episiotomy or your tear. This helps to reduce pain and swelling.  You may be given a squirt bottle to use when you go to the bathroom. You may use this until you are comfortable wiping as usual. To use the squirt bottle, follow these steps: ? Before you urinate, fill the squirt bottle with warm water. Do not use hot water. ? After you urinate, while you are sitting on the toilet, use the squirt bottle to rinse the area around your urethra and vaginal opening. This rinses away any urine and blood. ? You may do this  instead of wiping. As you start healing, you may use the squirt bottle before wiping yourself. Make sure to wipe gently. ? Fill the squirt bottle with clean water every time you use the bathroom.  You will be given sanitary pads to wear. How can I expect to feel?  You may not feel the need to urinate for several hours after delivery.  You will have some soreness and pain in your abdomen and vagina.  It is normal to have vaginal bleeding (lochia) after delivery. The amount and appearance of lochia is often similar to a menstrual period in the first week after delivery. It will gradually decrease over the next few weeks to a dry, yellow-brown discharge. For most women, lochia stops completely by 2-3 weeks after delivery. Vaginal bleeding can vary from woman to woman.  Within the first few days after delivery, you may have breast engorgement. This is when your breasts feel heavy, full, and uncomfortable. Your breasts may also throb and feel hard, tightly stretched, warm, and tender. After this occurs, you may have milk leaking from your breasts.Your health care provider can help you relieve discomfort due to breast engorgement. Breast engorgement should go  away within a few days.  You may feel more sad or worried than normal due to hormonal changes after delivery. These feelings should not last more than a few days. If these feelings do not go away after several days, speak with your health care provider. How should I care for myself?  Tell your health care provider if you have pain or discomfort.  Drink enough water to keep your urine clear or pale yellow.  Avoid touching your breasts a lot. Doing this can make your breasts produce more milk  If you become weak or lightheaded, or you feel like you might faint, ask for help before: ? Getting out of bed. ? Showering.  Change your sanitary pads frequently.   Make sure that all your vaccinations are up to date. This can help protect you and  your baby from getting certain diseases. You may need to have immunizations done before you leave the hospital.  If desired, talk with your health care provider about methods of family planning or birth control (contraception).  Document Released: 06/05/2007 Document Revised: 01/11/2016 Document Reviewed: 07/13/2015 Elsevier Interactive Patient Education  [employer]2018 [employer]

## 2017-09-18 NOTE — Plan of Care (Signed)
Pastoral Service visited patient and her significant other and provided support and prayers for them. Both parties were receptive to the visit.

## 2017-09-19 LAB — AFP, SERUM, OPEN SPINA BIFIDA
AFP MOM: 1.38
AFP VALUE AFPOSL: 64.8 ng/mL
Gest. Age on Collection Date: 19 weeks
Maternal Age At EDD: 31.2 yr
OSBR Risk 1 IN: 7620
Test Results:: NEGATIVE
WEIGHT: 182 [lb_av]

## 2017-09-20 ENCOUNTER — Other Ambulatory Visit: Payer: Self-pay | Admitting: Certified Nurse Midwife

## 2017-09-20 DIAGNOSIS — Z3480 Encounter for supervision of other normal pregnancy, unspecified trimester: Secondary | ICD-10-CM

## 2017-09-21 ENCOUNTER — Encounter: Payer: Self-pay | Admitting: *Deleted

## 2017-10-02 ENCOUNTER — Encounter: Payer: Self-pay | Admitting: Certified Nurse Midwife

## 2017-10-02 ENCOUNTER — Ambulatory Visit (INDEPENDENT_AMBULATORY_CARE_PROVIDER_SITE_OTHER): Payer: Medicaid Other | Admitting: Certified Nurse Midwife

## 2017-10-02 VITALS — BP 129/82 | HR 85 | Wt 190.6 lb

## 2017-10-02 DIAGNOSIS — B373 Candidiasis of vulva and vagina: Secondary | ICD-10-CM

## 2017-10-02 DIAGNOSIS — B9689 Other specified bacterial agents as the cause of diseases classified elsewhere: Secondary | ICD-10-CM

## 2017-10-02 DIAGNOSIS — B3731 Acute candidiasis of vulva and vagina: Secondary | ICD-10-CM

## 2017-10-02 DIAGNOSIS — N760 Acute vaginitis: Secondary | ICD-10-CM | POA: Diagnosis not present

## 2017-10-02 DIAGNOSIS — O039 Complete or unspecified spontaneous abortion without complication: Secondary | ICD-10-CM

## 2017-10-02 DIAGNOSIS — L309 Dermatitis, unspecified: Secondary | ICD-10-CM

## 2017-10-02 MED ORDER — SERTRALINE HCL 100 MG PO TABS: 50.0000 mg | ORAL_TABLET | Freq: Every day | ORAL | 12 refills | Status: AC

## 2017-10-02 NOTE — Patient Instructions (Signed)
Bacterial Vaginosis Bacterial vaginosis is a vaginal infection that occurs when the normal balance of bacteria in the vagina is disrupted. It results from an overgrowth of certain bacteria. This is the most common vaginal infection among women ages 15-44. Because bacterial vaginosis increases your risk for STIs (sexually transmitted infections), getting treated can help reduce your risk for chlamydia, gonorrhea, herpes, and HIV (human immunodeficiency virus). Treatment is also important for preventing complications in pregnant women, because this condition can cause an early (premature) delivery. What are the causes? This condition is caused by an increase in harmful bacteria that are normally present in small amounts in the vagina. However, the reason that the condition develops is not fully understood. What increases the risk? The following factors may make you more likely to develop this condition:  Having a new sexual partner or multiple sexual partners.  Having unprotected sex.  Douching.  Having an intrauterine device (IUD).  Smoking.  Drug and alcohol abuse.  Taking certain antibiotic medicines.  Being pregnant.  You cannot get bacterial vaginosis from toilet seats, bedding, swimming pools, or contact with objects around you. What are the signs or symptoms? Symptoms of this condition include:  Grey or white vaginal discharge. The discharge can also be watery or foamy.  A fish-like odor with discharge, especially after sexual intercourse or during menstruation.  Itching in and around the vagina.  Burning or pain with urination.  Some women with bacterial vaginosis have no signs or symptoms. How is this diagnosed? This condition is diagnosed based on:  Your medical history.  A physical exam of the vagina.  Testing a sample of vaginal fluid under a microscope to look for a large amount of bad bacteria or abnormal cells. Your health care provider may use a cotton swab  or a small wooden spatula to collect the sample.  How is this treated? This condition is treated with antibiotics. These may be given as a pill, a vaginal cream, or a medicine that is put into the vagina (suppository). If the condition comes back after treatment, a second round of antibiotics may be needed. Follow these instructions at home: Medicines  Take over-the-counter and prescription medicines only as told by your health care provider.  Take or use your antibiotic as told by your health care provider. Do not stop taking or using the antibiotic even if you start to feel better. General instructions  If you have a female sexual partner, tell her that you have a vaginal infection. She should see her health care provider and be treated if she has symptoms. If you have a female sexual partner, he does not need treatment.  During treatment: ? Avoid sexual activity until you finish treatment. ? Do not douche. ? Avoid alcohol as directed by your health care provider. ? Avoid breastfeeding as directed by your health care provider.  Drink enough water and fluids to keep your urine clear or pale yellow.  Keep the area around your vagina and rectum clean. ? Wash the area daily with warm water. ? Wipe yourself from front to back after using the toilet.  Keep all follow-up visits as told by your health care provider. This is important. How is this prevented?  Do not douche.  Wash the outside of your vagina with warm water only.  Use protection when having sex. This includes latex condoms and dental dams.  Limit how many sexual partners you have. To help prevent bacterial vaginosis, it is best to have sex with just   one partner (monogamous).  Make sure you and your sexual partner are tested for STIs.  Wear cotton or cotton-lined underwear.  Avoid wearing tight pants and pantyhose, especially during summer.  Limit the amount of alcohol that you drink.  Do not use any products that  contain nicotine or tobacco, such as cigarettes and e-cigarettes. If you need help quitting, ask your health care provider.  Do not use illegal drugs. Where to find more information:  Centers for Disease Control and Prevention: www.cdc.gov/std  American Sexual Health Association (ASHA): www.ashastd.org  U.S. Department of Health and Human Services, Office on Women's Health: www.womenshealth.gov/ or https://www.womenshealth.gov/a-z-topics/bacterial-vaginosis Contact a health care provider if:  Your symptoms do not improve, even after treatment.  You have more discharge or pain when urinating.  You have a fever.  You have pain in your abdomen.  You have pain during sex.  You have vaginal bleeding between periods. Summary  Bacterial vaginosis is a vaginal infection that occurs when the normal balance of bacteria in the vagina is disrupted.  Because bacterial vaginosis increases your risk for STIs (sexually transmitted infections), getting treated can help reduce your risk for chlamydia, gonorrhea, herpes, and HIV (human immunodeficiency virus). Treatment is also important for preventing complications in pregnant women, because the condition can cause an early (premature) delivery.  This condition is treated with antibiotic medicines. These may be given as a pill, a vaginal cream, or a medicine that is put into the vagina (suppository). This information is not intended to replace advice given to you by your health care provider. Make sure you discuss any questions you have with your health care provider. Document Released: 08/08/2005 Document Revised: 12/12/2016 Document Reviewed: 04/23/2016 Elsevier Interactive Patient Education  2018 Elsevier Inc.  

## 2017-10-02 NOTE — Progress Notes (Signed)
Post Partum Exam  Amanda Peters is a 32 y.o. [id number] female who presents for a postpartum visit. She is 2 weeks postpartum following a spontaneous vaginal delivery. I have fully reviewed the prenatal and intrapartum course. The delivery was at 19 gestational weeks.  Anesthesia: PCA. Bowel function is normal. Bladder function is normal. Patient is not sexually active. Contraception method is abstinence. Postpartum depression screening: EPDS: 10.  Denies homicidal/suicidal thoughts.  States very sad over the loss and does not do much around the house or leave the house much right now.  Partner present for exam.    The following portions of the patient's history were reviewed and updated as appropriate: allergies, current medications, past family history, past medical history, past social history, past surgical history and problem list. Last pap smear done 01/24/17 and was Normal  Review of Systems Pertinent items noted in HPI and remainder of comprehensive ROS otherwise negative.    Objective:  Last menstrual period 05/04/2017.  General:  alert, cooperative, no distress and teary-eyed   Breasts:  inspection negative, no nipple discharge or bleeding, no masses or nodularity palpable  Lungs: clear to auscultation bilaterally  Heart:  regular rate and rhythm, S1, S2 normal, no murmur, click, rub or gallop  Abdomen: soft, non-tender; bowel sounds normal; no masses,  no organomegaly  Pelvic/Rectal Exam: Not performed.        Assessment & Plan   1. 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   - sertraline (ZOLOFT) 100 MG tablet; Take 0.5 tablets (50 mg total) by mouth daily.  Dispense: 30 tablet; Refill: 12 - Ambulatory referral to [facility] - Prenat-FeAsp-Meth-FA-DHA w/o A (PRENATE PIXIE) 10-0.6-0.4-200 MG CAPS; Take 1 tablet by mouth daily.  Dispense: 30 capsule; Refill: 12  2. Eczema, unspecified type     - Betamethasone Dipropionate (SERNIVO) 0.05 %  EMUL; Apply 1 spray topically daily.  Dispense: 120 mL; Refill: 4  3. Yeast vaginitis    - terconazole (TERAZOL 3) 0.8 % vaginal cream; Place 1 applicator vaginally at bedtime.  Dispense: 20 g; Refill: 0 - fluconazole (DIFLUCAN) 200 MG tablet; Take 1 tablet (200 mg total) by mouth once for 1 dose. Repeat dose in 48-72 hours.  Dispense: 3 tablet; Refill: 0  4. BV (bacterial vaginosis)    - metroNIDAZOLE (FLAGYL) 500 MG tablet; Take 1 tablet (500 mg total) by mouth 2 (two) times daily.  Dispense: 14 tablet; Refill: 0    1. Contraception: abstinence and condoms 2.  Desires another pregnancy soon.  PNV started.  Counseling encouraged.  3. Follow up in: 3 months or as needed.

## 2017-10-04 ENCOUNTER — Encounter: Payer: Self-pay | Admitting: Certified Nurse Midwife

## 2017-10-04 MED ORDER — FLUCONAZOLE 200 MG PO TABS: 200.0000 mg | ORAL_TABLET | Freq: Once | ORAL | 0 refills | Status: AC

## 2017-10-04 MED ORDER — BETAMETHASONE DIPROPIONATE 0.05 % EX EMUL: 1.0000 | Freq: Every day | CUTANEOUS | 4 refills | Status: AC

## 2017-10-04 MED ORDER — PRENATE PIXIE 10-0.6-0.4-200 MG PO CAPS: 1.0000 | ORAL_CAPSULE | Freq: Every day | ORAL | 12 refills | Status: AC

## 2017-10-04 MED ORDER — TERCONAZOLE 0.8 % VA CREA: 1.0000 | TOPICAL_CREAM | Freq: Every day | VAGINAL | 0 refills | Status: DC

## 2017-10-04 MED ORDER — METRONIDAZOLE 500 MG PO TABS: 500.0000 mg | ORAL_TABLET | Freq: Two times a day (BID) | ORAL | 0 refills | Status: DC

## 2017-10-05 ENCOUNTER — Telehealth: Payer: Self-pay

## 2017-10-05 NOTE — Telephone Encounter (Signed)
Returned call and advised pt that rx had been sent by provider

## 2017-10-12 ENCOUNTER — Encounter: Payer: Medicaid Other | Admitting: Certified Nurse Midwife

## 2017-10-16 ENCOUNTER — Telehealth: Payer: Self-pay

## 2017-10-16 NOTE — Telephone Encounter (Signed)
PA  Obtained, pharmacy notified.  ZO#[id number] Effective 10/16/17-10/11/18

## 2017-10-19 ENCOUNTER — Telehealth: Payer: Self-pay

## 2017-10-19 NOTE — Telephone Encounter (Signed)
PHARMACY NOTIFIED OF PA 10/16/17. [id number] Effective 10/16/17-10/11/17 SERNIVO

## 2018-01-11 ENCOUNTER — Other Ambulatory Visit (HOSPITAL_COMMUNITY)
Admission: RE | Admit: 2018-01-11 | Discharge: 2018-01-11 | Disposition: A | Discharge status: Home / Self Care | Payer: BLUE CROSS/BLUE SHIELD | Source: Ambulatory Visit | Attending: Certified Nurse Midwife | Admitting: Certified Nurse Midwife

## 2018-01-11 ENCOUNTER — Encounter: Payer: Self-pay | Admitting: Certified Nurse Midwife

## 2018-01-11 ENCOUNTER — Ambulatory Visit (INDEPENDENT_AMBULATORY_CARE_PROVIDER_SITE_OTHER): Payer: BLUE CROSS/BLUE SHIELD | Admitting: Certified Nurse Midwife

## 2018-01-11 VITALS — BP 124/86 | HR 83 | Wt 206.0 lb

## 2018-01-11 DIAGNOSIS — R635 Abnormal weight gain: Secondary | ICD-10-CM | POA: Diagnosis not present

## 2018-01-11 DIAGNOSIS — B3731 Acute candidiasis of vulva and vagina: Secondary | ICD-10-CM

## 2018-01-11 DIAGNOSIS — N760 Acute vaginitis: Secondary | ICD-10-CM

## 2018-01-11 DIAGNOSIS — B9689 Other specified bacterial agents as the cause of diseases classified elsewhere: Secondary | ICD-10-CM

## 2018-01-11 DIAGNOSIS — B373 Candidiasis of vulva and vagina: Secondary | ICD-10-CM

## 2018-01-11 MED ORDER — FLUCONAZOLE 200 MG PO TABS: 200.0000 mg | ORAL_TABLET | Freq: Once | ORAL | 2 refills | Status: AC

## 2018-01-11 MED ORDER — SECNIDAZOLE 2 G PO PACK: 1.0000 | PACK | Freq: Once | ORAL | 0 refills | Status: AC

## 2018-01-11 MED ORDER — METRONIDAZOLE 0.75 % VA GEL: 1.0000 | VAGINAL | 4 refills | Status: AC

## 2018-01-11 MED ORDER — TERCONAZOLE 0.8 % VA CREA: 1.0000 | TOPICAL_CREAM | Freq: Every day | VAGINAL | 2 refills | Status: AC

## 2018-01-11 NOTE — Progress Notes (Signed)
Patient ID: Amanda Peters, female   DOB: [birth year], 32 y.o.   MRN: [id number]  Chief Complaint  Patient presents with  . Gynecologic Exam    pt states frequent BV    HPI Amanda Peters is a 32 y.o. female.  Patient here for f/u from recent SAB/depression.  Depression screening improved since last visit.  Is not taking Zoloft or going to counseling; both encouraged.  Currently living in [place].  Works nights.  Reports malodorous discharge that stared in April? Desires treatment.    HPI  Past Medical History:  Diagnosis Date  . Breast abscess    left  . Chlamydia   . Headache    migraines  . Infection    UTI  . Psoriasis     Past Surgical History:  Procedure Laterality Date  . BREAST SURGERY Left   . INCISION AND DRAINAGE ABSCESS Left 10/17/2016   Procedure: INCISION AND DRAINAGE LEFT BREAST ABSCESS;  Surgeon: Chevis Pretty III, MD;  Location: [facility] OR;  Service: General;  Laterality: Left;    Family History  Problem Relation Age of Onset  . Hypothyroidism Mother   . Hypertension Mother   . Hypertension Father     Social History Social History   Tobacco Use  . Smoking status: Former Smoker    Types: Cigarettes  . Smokeless tobacco: Never Used  Substance Use Topics  . Alcohol use: No    Alcohol/week: 0.0 oz    Frequency: Never    Comment: Ocassionally  . Drug use: No    Types: Marijuana    Comment: has not in 6 months    No Known Allergies  Current Outpatient Medications  Medication Sig Dispense Refill  . acetaminophen (TYLENOL) 500 MG tablet Take 1,000 mg by mouth every 6 (six) hours as needed for moderate pain or headache.    . Betamethasone Dipropionate (SERNIVO) 0.05 % EMUL Apply 1 spray topically daily. (Patient not taking: Reported on 01/11/2018) 120 mL 4  . fluconazole (DIFLUCAN) 200 MG tablet Take 1 tablet (200 mg total) by mouth once for 1 dose. Repeat dose in 48-72 hours. 3 tablet 2  . ibuprofen (ADVIL,MOTRIN) 600 MG tablet Take 1 tablet (600 mg total)  by mouth every 6 (six) hours. (Patient not taking: Reported on 01/11/2018) 30 tablet 0  . metroNIDAZOLE (METROGEL VAGINAL) 0.75 % vaginal gel Place 1 Applicatorful vaginally 2 (two) times a week. 70 g 4  . Prenat-FeAsp-Meth-FA-DHA w/o A (PRENATE PIXIE) 10-0.6-0.4-200 MG CAPS Take 1 tablet by mouth daily. (Patient not taking: Reported on 01/11/2018) 30 capsule 12  . Secnidazole (SOLOSEC) 2 g PACK Take 1 Package by mouth once for 1 dose. 1 each 0  . sertraline (ZOLOFT) 100 MG tablet Take 0.5 tablets (50 mg total) by mouth daily. (Patient not taking: Reported on 01/11/2018) 30 tablet 12  . terconazole (TERAZOL 3) 0.8 % vaginal cream Place 1 applicator vaginally at bedtime. 20 g 2   No current facility-administered medications for this visit.     Review of Systems Review of Systems Constitutional: negative for fatigue and weight loss Respiratory: negative for cough and wheezing Cardiovascular: negative for chest pain, fatigue and palpitations Gastrointestinal: negative for abdominal pain and change in bowel habits Genitourinary:+ vaginal discharge Integument/breast: negative for nipple discharge Musculoskeletal:negative for myalgias Neurological: negative for gait problems and tremors Behavioral/Psych: negative for abusive relationship, depression Endocrine: negative for temperature intolerance      Blood pressure 124/86, pulse 83, weight 206 lb (93.4 kg), last menstrual period 01/01/2018,  unknown if currently breastfeeding.  Physical Exam Physical Exam General:   alert  Skin:   no rash or abnormalities  Lungs:   clear to auscultation bilaterally  Heart:   regular rate and rhythm, S1, S2 normal, no murmur, click, rub or gallop  Breasts:   deferred  Abdomen:  normal findings: no organomegaly, soft, non-tender and no hernia  Pelvis:  External genitalia: normal general appearance Urinary system: urethral meatus normal and bladder without fullness, nontender Vaginal: normal without  tenderness, induration or masses, + thin, gray vaginal discharge, + chunky white discharge Cervix: no CMT Adnexa: normal bimanual exam Uterus: anteverted and non-tender, normal size    50% of 20 min visit spent on counseling and coordination of care.   Data Reviewed Previous medical hx, meds, labs, [place]    Assessment      1. BV (bacterial vaginosis)     Recurrent BV - Secnidazole (SOLOSEC) 2 g PACK; Take 1 Package by mouth once for 1 dose.  Dispense: 1 each; Refill: 0 - metroNIDAZOLE (METROGEL VAGINAL) 0.75 % vaginal gel; Place 1 Applicatorful vaginally 2 (two) times a week.  Dispense: 70 g; Refill: 4  2. Yeast vaginitis     - fluconazole (DIFLUCAN) 200 MG tablet; Take 1 tablet (200 mg total) by mouth once for 1 dose. Repeat dose in 48-72 hours.  Dispense: 3 tablet; Refill: 2 - terconazole (TERAZOL 3) 0.8 % vaginal cream; Place 1 applicator vaginally at bedtime.  Dispense: 20 g; Refill: 2  3. Weight gain     Exercise, diet discussed  4. Resolving depression: stable     Counseling and medications if desired encouraged.     Plan    No orders of the defined types were placed in this encounter.  Meds ordered this encounter  Medications  . Secnidazole (SOLOSEC) 2 g PACK    Sig: Take 1 Package by mouth once for 1 dose.    Dispense:  1 each    Refill:  0  . metroNIDAZOLE (METROGEL VAGINAL) 0.75 % vaginal gel    Sig: Place 1 Applicatorful vaginally 2 (two) times a week.    Dispense:  70 g    Refill:  4  . fluconazole (DIFLUCAN) 200 MG tablet    Sig: Take 1 tablet (200 mg total) by mouth once for 1 dose. Repeat dose in 48-72 hours.    Dispense:  3 tablet    Refill:  2  . terconazole (TERAZOL 3) 0.8 % vaginal cream    Sig: Place 1 applicator vaginally at bedtime.    Dispense:  20 g    Refill:  2    Follow up as needed.

## 2018-01-11 NOTE — Addendum Note (Signed)
Addended by: Natale Milch D on: 01/11/2018 04:55 PM   Modules accepted: Orders

## 2018-01-16 LAB — CERVICOVAGINAL ANCILLARY ONLY
BACTERIAL VAGINITIS: POSITIVE — AB
CANDIDA VAGINITIS: POSITIVE — AB

## 2018-01-18 ENCOUNTER — Other Ambulatory Visit: Payer: Self-pay | Admitting: Certified Nurse Midwife

## 2018-03-15 ENCOUNTER — Ambulatory Visit (INDEPENDENT_AMBULATORY_CARE_PROVIDER_SITE_OTHER): Payer: BLUE CROSS/BLUE SHIELD | Admitting: Obstetrics

## 2018-03-15 ENCOUNTER — Other Ambulatory Visit (HOSPITAL_COMMUNITY)
Admission: RE | Admit: 2018-03-15 | Discharge: 2018-03-15 | Disposition: A | Discharge status: Home / Self Care | Payer: BLUE CROSS/BLUE SHIELD | Source: Ambulatory Visit | Attending: Obstetrics | Admitting: Obstetrics

## 2018-03-15 ENCOUNTER — Encounter: Payer: Self-pay | Admitting: Obstetrics

## 2018-03-15 VITALS — BP 119/80 | HR 82 | Ht 62.25 in | Wt 209.9 lb

## 2018-03-15 DIAGNOSIS — Z01419 Encounter for gynecological examination (general) (routine) without abnormal findings: Secondary | ICD-10-CM | POA: Diagnosis not present

## 2018-03-15 DIAGNOSIS — R87610 Atypical squamous cells of undetermined significance on cytologic smear of cervix (ASC-US): Secondary | ICD-10-CM | POA: Diagnosis not present

## 2018-03-15 DIAGNOSIS — G8929 Other chronic pain: Secondary | ICD-10-CM

## 2018-03-15 DIAGNOSIS — Z0000 Encounter for general adult medical examination without abnormal findings: Secondary | ICD-10-CM

## 2018-03-15 DIAGNOSIS — Z113 Encounter for screening for infections with a predominantly sexual mode of transmission: Secondary | ICD-10-CM

## 2018-03-15 DIAGNOSIS — M546 Pain in thoracic spine: Secondary | ICD-10-CM

## 2018-03-15 DIAGNOSIS — E669 Obesity, unspecified: Secondary | ICD-10-CM | POA: Diagnosis not present

## 2018-03-15 DIAGNOSIS — Z30011 Encounter for initial prescription of contraceptive pills: Secondary | ICD-10-CM

## 2018-03-15 DIAGNOSIS — Z3009 Encounter for other general counseling and advice on contraception: Secondary | ICD-10-CM

## 2018-03-15 DIAGNOSIS — Z3202 Encounter for pregnancy test, result negative: Secondary | ICD-10-CM | POA: Diagnosis not present

## 2018-03-15 DIAGNOSIS — Z1329 Encounter for screening for other suspected endocrine disorder: Secondary | ICD-10-CM | POA: Diagnosis not present

## 2018-03-15 DIAGNOSIS — N898 Other specified noninflammatory disorders of vagina: Secondary | ICD-10-CM

## 2018-03-15 DIAGNOSIS — R519 Headache, unspecified: Secondary | ICD-10-CM

## 2018-03-15 DIAGNOSIS — L409 Psoriasis, unspecified: Secondary | ICD-10-CM

## 2018-03-15 DIAGNOSIS — R51 Headache: Secondary | ICD-10-CM

## 2018-03-15 LAB — POCT URINE PREGNANCY: Preg Test, Ur: NEGATIVE

## 2018-03-15 MED ORDER — IBUPROFEN 800 MG PO TABS: 800.0000 mg | ORAL_TABLET | Freq: Three times a day (TID) | ORAL | 5 refills | Status: AC | PRN

## 2018-03-15 MED ORDER — PHENTERMINE HCL 37.5 MG PO CAPS: 37.5000 mg | ORAL_CAPSULE | ORAL | 2 refills | Status: AC

## 2018-03-15 MED ORDER — NORGESTIM-ETH ESTRAD TRIPHASIC 0.18/0.215/0.25 MG-25 MCG PO TABS: 1.0000 | ORAL_TABLET | Freq: Every day | ORAL | 11 refills | Status: AC

## 2018-03-15 NOTE — Progress Notes (Signed)
Patient is in the office for annual, last pap 01-24-17. [facility] desires std testing today.

## 2018-03-15 NOTE — Progress Notes (Signed)
Subjective:        Amanda Peters is a 32 y.o. female here for a routine exam.  Current complaints: Back and shoulder pain from large and heavy breasts.  The pain has gotten worse over the past 2-3 years.  She wears a sports bra and another strong support bra without relief.  Has deep bra strap marks on shoulders.  Also has psoriasis that is worsening over the past months.    Personal health questionnaire:  Is patient [name], have a family history of breast and/or ovarian cancer: no Is there a family history of uterine cancer diagnosed at age < 3050, gastrointestinal cancer, urinary tract cancer, family member who is a Personnel officerLynch syndrome-associated carrier: no Is the patient overweight and hypertensive, family history of diabetes, personal history of gestational diabetes, preeclampsia or PCOS: no Is patient over 6555, have PCOS,  family history of premature CHD under age 31, diabetes, smoke, have hypertension or peripheral artery disease:  no At any time, has a partner hit, kicked or otherwise hurt or frightened you?: no Over the past 2 weeks, have you felt down, depressed or hopeless?: no Over the past 2 weeks, have you felt little interest or pleasure in doing things?:no   Gynecologic History No LMP recorded. Contraception: none Last Pap: 2018. Results were: normal Last mammogram: n/a. Results were: n/a  Obstetric History OB History  Gravida Para Term Preterm AB Living  2 0 0 0 1 0  SAB TAB Ectopic Multiple Live Births  1 0 0 0 0    # Outcome Date GA Lbr Len/2nd Weight Sex Delivery Anes PTL Lv  2 Gravida           1 SAB 10/20/16 [id number]           Past Medical History:  Diagnosis Date  . Breast abscess    left  . Chlamydia   . Headache    migraines  . Infection    UTI  . Psoriasis     Past Surgical History:  Procedure Laterality Date  . BREAST SURGERY Left   . INCISION AND DRAINAGE ABSCESS Left 10/17/2016   Procedure: INCISION AND DRAINAGE LEFT BREAST ABSCESS;   Surgeon: Chevis PrettyPaul Toth III, MD;  Location: [facility] OR;  Service: General;  Laterality: Left;     Current Outpatient Medications:  .  acetaminophen (TYLENOL) 500 MG tablet, Take 1,000 mg by mouth every 6 (six) hours as needed for moderate pain or headache., Disp: , Rfl:  .  Betamethasone Dipropionate (SERNIVO) 0.05 % EMUL, Apply 1 spray topically daily. (Patient not taking: Reported on 01/11/2018), Disp: 120 mL, Rfl: 4 .  ibuprofen (ADVIL,MOTRIN) 600 MG tablet, Take 1 tablet (600 mg total) by mouth every 6 (six) hours. (Patient not taking: Reported on 01/11/2018), Disp: 30 tablet, Rfl: 0 .  ibuprofen (ADVIL,MOTRIN) 800 MG tablet, Take 1 tablet (800 mg total) by mouth every 8 (eight) hours as needed., Disp: 30 tablet, Rfl: 5 .  metroNIDAZOLE (METROGEL VAGINAL) 0.75 % vaginal gel, Place 1 Applicatorful vaginally 2 (two) times a week. (Patient not taking: Reported on 03/15/2018), Disp: 70 g, Rfl: 4 .  Norgestimate-Ethinyl Estradiol Triphasic (ORTHO TRI-CYCLEN LO) 0.18/0.215/0.25 MG-25 MCG tab, Take 1 tablet by mouth daily., Disp: 1 Package, Rfl: 11 .  phentermine 37.5 MG capsule, Take 1 capsule (37.5 mg total) by mouth every morning., Disp: 30 capsule, Rfl: 2 .  Prenat-FeAsp-Meth-FA-DHA w/o A (PRENATE PIXIE) 10-0.6-0.4-200 MG CAPS, Take 1 tablet by mouth daily. (Patient not taking: Reported  on 01/11/2018), Disp: 30 capsule, Rfl: 12 .  sertraline (ZOLOFT) 100 MG tablet, Take 0.5 tablets (50 mg total) by mouth daily. (Patient not taking: Reported on 01/11/2018), Disp: 30 tablet, Rfl: 12 .  terconazole (TERAZOL 3) 0.8 % vaginal cream, Place 1 applicator vaginally at bedtime. (Patient not taking: Reported on 03/15/2018), Disp: 20 g, Rfl: 2 No Known Allergies  Social History   Tobacco Use  . Smoking status: Former Smoker    Types: Cigarettes  . Smokeless tobacco: Never Used  Substance Use Topics  . Alcohol use: No    Alcohol/week: 0.0 oz    Frequency: Never    Comment: Ocassionally    Family History  Problem  Relation Age of Onset  . Hypothyroidism Mother   . Hypertension Mother   . Hypertension Father       Review of Systems  Constitutional: negative for fatigue and weight loss Respiratory: negative for cough and wheezing Cardiovascular: negative for chest pain, fatigue and palpitations Gastrointestinal: negative for abdominal pain and change in bowel habits Musculoskeletal:POSITIVE for back and shoulder myalgias Neurological: negative for gait problems and tremors Behavioral/Psych: negative for abusive relationship, depression Endocrine: negative for temperature intolerance    Genitourinary:negative for abnormal menstrual periods, genital lesions, hot flashes, sexual problems and vaginal discharge Integument/breast: negative for breast lump, breast tenderness, nipple discharge.  POSITIVE for psoriatic scars     Objective:       BP 119/80   Pulse 82   Ht 5' 2.25" (1.581 m)   Wt 209 lb 14.4 oz (95.2 kg)   BMI 38.08 kg/m  General:   alert  Skin:   no rash or abnormalities  Lungs:   clear to auscultation bilaterally  Heart:   regular rate and rhythm, S1, S2 normal, no murmur, click, rub or gallop  Breasts:   normal without suspicious masses, skin or nipple changes or axillary nodes  Abdomen:  normal findings: no organomegaly, soft, non-tender and no hernia  Pelvis:  External genitalia: normal general appearance Urinary system: urethral meatus normal and bladder without fullness, nontender Vaginal: normal without tenderness, induration or masses Cervix: normal appearance Adnexa: normal bimanual exam Uterus: anteverted and non-tender, normal size   Lab Review Urine pregnancy test Labs reviewed yes Radiologic studies reviewed yes  50% of 20 min visit spent on counseling and coordination of care.   Assessment:     1. Encounter for routine gynecological examination with Papanicolaou smear of cervix Rx: - Cytology - PAP  2. Encounter for other general counseling and advice  on contraception - wants OCP's  3. Encounter for initial prescription of contraceptive pills Rx - POCT urine pregnancy - Norgestimate-Ethinyl Estradiol Triphasic (ORTHO TRI-CYCLEN LO) 0.18/0.215/0.25 MG-25 MCG tab; Take 1 tablet by mouth daily.  Dispense: 1 Package; Refill: 11  4. Vaginal discharge Rx: - Cervicovaginal ancillary only  5. Screening for STD (sexually transmitted disease) Rx: - HIV antibody (with reflex) - RPR - Hepatitis B Surface AntiGEN - Hepatitis C Antibody  6. Obesity (BMI 35.0-39.9 without comorbidity) Rx: - TSH - phentermine 37.5 MG capsule; Take 1 capsule (37.5 mg total) by mouth every morning.  Dispense: 30 capsule; Refill: 2  7. Psoriasis Rx: - Ambulatory referral to Dermatology  8. Chronic midline thoracic back pain Rx: - Ambulatory referral to Plastic Surgery  9. Chronic intractable headache, unspecified headache type Rx: - ibuprofen (ADVIL,MOTRIN) 800 MG tablet; Take 1 tablet (800 mg total) by mouth every 8 (eight) hours as needed.  Dispense: 30 tablet; Refill: 5  Plan:    Education reviewed: calcium supplements, depression evaluation, low fat, low cholesterol diet, safe sex/STD prevention, self breast exams and weight bearing exercise. Contraception: OCP (estrogen/progesterone). Follow up in: 1 year.   Meds ordered this encounter  Medications  . Norgestimate-Ethinyl Estradiol Triphasic (ORTHO TRI-CYCLEN LO) 0.18/0.215/0.25 MG-25 MCG tab    Sig: Take 1 tablet by mouth daily.    Dispense:  1 Package    Refill:  11  . phentermine 37.5 MG capsule    Sig: Take 1 capsule (37.5 mg total) by mouth every morning.    Dispense:  30 capsule    Refill:  2  . ibuprofen (ADVIL,MOTRIN) 800 MG tablet    Sig: Take 1 tablet (800 mg total) by mouth every 8 (eight) hours as needed.    Dispense:  30 tablet    Refill:  5   Orders Placed This Encounter  Procedures  . HIV antibody (with reflex)  . RPR  . Hepatitis B Surface AntiGEN  . Hepatitis C  Antibody  . TSH  . Ambulatory referral to Dermatology    Referral Priority:   Routine    Referral Type:   Consultation    Referral Reason:   Specialty Services Required    Requested Specialty:   Dermatology    Number of Visits Requested:   1  . Ambulatory referral to Plastic Surgery    Referral Priority:   Routine    Referral Type:   Surgical    Referral Reason:   Specialty Services Required    Requested Specialty:   Plastic Surgery    Number of Visits Requested:   1  . POCT urine pregnancy    Brock Bad MD 03-15-2018

## 2018-03-16 LAB — CERVICOVAGINAL ANCILLARY ONLY
Bacterial vaginitis: NEGATIVE
Candida vaginitis: NEGATIVE
Chlamydia: NEGATIVE
Neisseria Gonorrhea: NEGATIVE
TRICH (WINDOWPATH): NEGATIVE

## 2018-03-16 LAB — HEPATITIS C ANTIBODY

## 2018-03-16 LAB — HIV ANTIBODY (ROUTINE TESTING W REFLEX): HIV SCREEN 4TH GENERATION: NONREACTIVE

## 2018-03-16 LAB — RPR: RPR: NONREACTIVE

## 2018-03-16 LAB — TSH: TSH: 4.86 u[IU]/mL — ABNORMAL HIGH (ref 0.450–4.500)

## 2018-03-16 LAB — HEPATITIS B SURFACE ANTIGEN: Hepatitis B Surface Ag: NEGATIVE

## 2018-03-19 LAB — CYTOLOGY - PAP
Adequacy: ABSENT — AB
DIAGNOSIS: UNDETERMINED — AB
HPV 16/18/45 GENOTYPING: NEGATIVE
HPV: DETECTED — AB

## 2018-03-20 LAB — THYROID PANEL
FREE THYROXINE INDEX: 1.7 (ref 1.2–4.9)
T3 UPTAKE RATIO: 26 % (ref 24–39)
T4, Total: 6.5 ug/dL (ref 4.5–12.0)

## 2018-03-20 LAB — SPECIMEN STATUS REPORT

## 2018-04-18 DIAGNOSIS — L400 Psoriasis vulgaris: Secondary | ICD-10-CM | POA: Diagnosis not present

## 2018-05-23 ENCOUNTER — Other Ambulatory Visit (HOSPITAL_COMMUNITY)
Admission: RE | Admit: 2018-05-23 | Discharge: 2018-05-23 | Disposition: A | Discharge status: Home / Self Care | Payer: BLUE CROSS/BLUE SHIELD | Source: Ambulatory Visit | Attending: Obstetrics | Admitting: Obstetrics

## 2018-05-23 ENCOUNTER — Ambulatory Visit (INDEPENDENT_AMBULATORY_CARE_PROVIDER_SITE_OTHER): Payer: BLUE CROSS/BLUE SHIELD | Admitting: Obstetrics

## 2018-05-23 ENCOUNTER — Encounter: Payer: Self-pay | Admitting: Obstetrics

## 2018-05-23 VITALS — BP 116/81 | HR 79 | Wt 199.0 lb

## 2018-05-23 DIAGNOSIS — Z87891 Personal history of nicotine dependence: Secondary | ICD-10-CM | POA: Insufficient documentation

## 2018-05-23 DIAGNOSIS — Z3202 Encounter for pregnancy test, result negative: Secondary | ICD-10-CM | POA: Diagnosis not present

## 2018-05-23 DIAGNOSIS — N898 Other specified noninflammatory disorders of vagina: Secondary | ICD-10-CM

## 2018-05-23 DIAGNOSIS — Z113 Encounter for screening for infections with a predominantly sexual mode of transmission: Secondary | ICD-10-CM | POA: Diagnosis not present

## 2018-05-23 LAB — POCT URINE PREGNANCY: Preg Test, Ur: NEGATIVE

## 2018-05-23 NOTE — Progress Notes (Signed)
Pt presents for all STD testing with blood work.  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

## 2018-05-23 NOTE — Progress Notes (Signed)
Patient ID: Amanda Peters, female   DOB: September 28, 1985, 32 y.o.   MRN: [id number]  Chief Complaint  Patient presents with  . STD testing    HPI Amanda Peters is a 32 y.o. female.  Complains of vaginal discharge, and possible exposure to STD. HPI  Past Medical History:  Diagnosis Date  . Breast abscess    left  . Chlamydia   . Headache    migraines  . Infection    UTI  . Psoriasis     Past Surgical History:  Procedure Laterality Date  . BREAST SURGERY Left   . INCISION AND DRAINAGE ABSCESS Left 10/17/2016   Procedure: INCISION AND DRAINAGE LEFT BREAST ABSCESS;  Surgeon: Chevis Pretty III, MD;  Location: [facility] OR;  Service: General;  Laterality: Left;    Family History  Problem Relation Age of Onset  . Hypothyroidism Mother   . Hypertension Mother   . Hypertension Father     Social History Social History   Tobacco Use  . Smoking status: Former Smoker    Types: Cigarettes  . Smokeless tobacco: Never Used  Substance Use Topics  . Alcohol use: No    Alcohol/week: 0.0 standard drinks    Frequency: Never    Comment: Ocassionally  . Drug use: No    Types: Marijuana    Comment: has not in 6 months    No Known Allergies  Current Outpatient Medications  Medication Sig Dispense Refill  . Betamethasone Dipropionate (SERNIVO) 0.05 % EMUL Apply 1 spray topically daily. 120 mL 4  . ibuprofen (ADVIL,MOTRIN) 800 MG tablet Take 1 tablet (800 mg total) by mouth every 8 (eight) hours as needed. 30 tablet 5  . Norgestimate-Ethinyl Estradiol Triphasic (ORTHO TRI-CYCLEN LO) 0.18/0.215/0.25 MG-25 MCG tab Take 1 tablet by mouth daily. 1 Package 11  . phentermine 37.5 MG capsule Take 1 capsule (37.5 mg total) by mouth every morning. 30 capsule 2  . Prenat-FeAsp-Meth-FA-DHA w/o A (PRENATE PIXIE) 10-0.6-0.4-200 MG CAPS Take 1 tablet by mouth daily. 30 capsule 12  . acetaminophen (TYLENOL) 500 MG tablet Take 1,000 mg by mouth every 6 (six) hours as needed for moderate pain or headache.    .  ibuprofen (ADVIL,MOTRIN) 600 MG tablet Take 1 tablet (600 mg total) by mouth every 6 (six) hours. (Patient not taking: Reported on 05/23/2018) 30 tablet 0  . metroNIDAZOLE (METROGEL VAGINAL) 0.75 % vaginal gel Place 1 Applicatorful vaginally 2 (two) times a week. (Patient not taking: Reported on 03/15/2018) 70 g 4  . sertraline (ZOLOFT) 100 MG tablet Take 0.5 tablets (50 mg total) by mouth daily. (Patient not taking: Reported on 05/23/2018) 30 tablet 12  . terconazole (TERAZOL 3) 0.8 % vaginal cream Place 1 applicator vaginally at bedtime. (Patient not taking: Reported on 03/15/2018) 20 g 2   No current facility-administered medications for this visit.     Review of Systems Review of Systems Constitutional: negative for fatigue and weight loss Respiratory: negative for cough and wheezing Cardiovascular: negative for chest pain, fatigue and palpitations Gastrointestinal: negative for abdominal pain and change in bowel habits Genitourinary:POSITIVE for vaginal discharge Integument/breast: negative for nipple discharge Musculoskeletal:negative for myalgias Neurological: negative for gait problems and tremors Behavioral/Psych: negative for abusive relationship, depression Endocrine: negative for temperature intolerance      Blood pressure 116/81, pulse 79, weight 199 lb (90.3 kg), last menstrual period 05/06/2018, unknown if currently breastfeeding.  Physical Exam Physical Exam           General:  Alert and no  distress Abdomen:  normal findings: no organomegaly, soft, non-tender and no hernia  Pelvis:  External genitalia: normal general appearance Urinary system: urethral meatus normal and bladder without fullness, nontender Vaginal: normal without tenderness, induration or masses Cervix: normal appearance Adnexa: normal bimanual exam Uterus: anteverted and non-tender, normal size    50% of 15 min visit spent on counseling and coordination of care.   Data Reviewed Wet  Prep Cultures  Assessment       1. Vaginal discharge Rx: - Cervicovaginal ancillary only  2. Screening for STD (sexually transmitted disease) Rx: - Hepatitis B surface antigen - Hepatitis C antibody - HIV Antibody (routine testing w rflx) - RPR - POCT urine pregnancy  Plan    Follow up prn  Orders Placed This Encounter  Procedures  . Hepatitis B surface antigen  . Hepatitis C antibody  . HIV Antibody (routine testing w rflx)  . RPR  . POCT urine pregnancy   No orders of the defined types were placed in this encounter.    Brock Bad MD 05-23-2018

## 2018-05-24 LAB — HEPATITIS C ANTIBODY

## 2018-05-24 LAB — HIV ANTIBODY (ROUTINE TESTING W REFLEX): HIV SCREEN 4TH GENERATION: NONREACTIVE

## 2018-05-24 LAB — HEPATITIS B SURFACE ANTIGEN: HEP B S AG: NEGATIVE

## 2018-05-24 LAB — RPR: RPR Ser Ql: NONREACTIVE

## 2018-05-25 LAB — CERVICOVAGINAL ANCILLARY ONLY
Bacterial vaginitis: NEGATIVE
Candida vaginitis: NEGATIVE
Chlamydia: NEGATIVE
Neisseria Gonorrhea: NEGATIVE
TRICH (WINDOWPATH): NEGATIVE

## 2018-08-13 ENCOUNTER — Telehealth: Payer: Self-pay

## 2018-08-13 DIAGNOSIS — B379 Candidiasis, unspecified: Secondary | ICD-10-CM

## 2018-08-13 DIAGNOSIS — B9689 Other specified bacterial agents as the cause of diseases classified elsewhere: Secondary | ICD-10-CM

## 2018-08-13 DIAGNOSIS — N760 Acute vaginitis: Secondary | ICD-10-CM

## 2018-08-13 MED ORDER — FLUCONAZOLE 150 MG PO TABS: 150.0000 mg | ORAL_TABLET | Freq: Once | ORAL | 0 refills | Status: AC

## 2018-08-13 MED ORDER — METRONIDAZOLE 500 MG PO TABS: 500.0000 mg | ORAL_TABLET | Freq: Two times a day (BID) | ORAL | 0 refills | Status: AC

## 2018-08-13 NOTE — Telephone Encounter (Signed)
Pt called stating that she is having a yeasty vaginal odor with a lot of discharge. Sx started about 2 weeks ago. She did use metrogel that she had left over for about 3 days. She is requesting a rx for bv and yeast. Per protocol rx sent for both requested rx.

## 2018-10-24 ENCOUNTER — Other Ambulatory Visit: Payer: Self-pay | Admitting: Obstetrics

## 2018-10-24 DIAGNOSIS — E669 Obesity, unspecified: Secondary | ICD-10-CM

## 2019-01-02 ENCOUNTER — Encounter: Payer: Self-pay | Admitting: *Deleted

## 2019-01-04 ENCOUNTER — Telehealth: Payer: Self-pay | Admitting: *Deleted

## 2019-01-04 NOTE — Telephone Encounter (Signed)
Attempt to contact pt regarding Rx request. LM on VM to call as needed. Skin solution was denied- follow up PCP or urgent care.

## 2019-01-05 DIAGNOSIS — L539 Erythematous condition, unspecified: Secondary | ICD-10-CM | POA: Diagnosis not present

## 2019-01-05 DIAGNOSIS — L299 Pruritus, unspecified: Secondary | ICD-10-CM | POA: Diagnosis not present

## 2019-01-05 DIAGNOSIS — L409 Psoriasis, unspecified: Secondary | ICD-10-CM | POA: Diagnosis not present

## 2019-01-05 DIAGNOSIS — R21 Rash and other nonspecific skin eruption: Secondary | ICD-10-CM | POA: Diagnosis not present

## 2019-01-09 ENCOUNTER — Ambulatory Visit: Payer: BLUE CROSS/BLUE SHIELD

## 2019-03-11 DIAGNOSIS — H5203 Hypermetropia, bilateral: Secondary | ICD-10-CM | POA: Diagnosis not present

## 2019-04-01 DIAGNOSIS — E6609 Other obesity due to excess calories: Secondary | ICD-10-CM | POA: Diagnosis not present

## 2019-04-01 DIAGNOSIS — M25519 Pain in unspecified shoulder: Secondary | ICD-10-CM | POA: Diagnosis not present

## 2019-04-01 DIAGNOSIS — Z6836 Body mass index (BMI) 36.0-36.9, adult: Secondary | ICD-10-CM | POA: Diagnosis not present

## 2019-04-01 DIAGNOSIS — Z0000 Encounter for general adult medical examination without abnormal findings: Secondary | ICD-10-CM | POA: Diagnosis not present

## 2019-04-01 DIAGNOSIS — Z13220 Encounter for screening for lipoid disorders: Secondary | ICD-10-CM | POA: Diagnosis not present

## 2019-04-01 DIAGNOSIS — L409 Psoriasis, unspecified: Secondary | ICD-10-CM | POA: Diagnosis not present

## 2019-04-18 DIAGNOSIS — R0683 Snoring: Secondary | ICD-10-CM | POA: Diagnosis not present

## 2019-04-18 DIAGNOSIS — E669 Obesity, unspecified: Secondary | ICD-10-CM | POA: Diagnosis not present

## 2019-04-24 DIAGNOSIS — Z713 Dietary counseling and surveillance: Secondary | ICD-10-CM | POA: Diagnosis not present

## 2019-04-30 DIAGNOSIS — Z713 Dietary counseling and surveillance: Secondary | ICD-10-CM | POA: Diagnosis not present

## 2019-05-07 DIAGNOSIS — N898 Other specified noninflammatory disorders of vagina: Secondary | ICD-10-CM | POA: Diagnosis not present

## 2019-05-07 DIAGNOSIS — N939 Abnormal uterine and vaginal bleeding, unspecified: Secondary | ICD-10-CM | POA: Diagnosis not present

## 2019-05-07 DIAGNOSIS — Z113 Encounter for screening for infections with a predominantly sexual mode of transmission: Secondary | ICD-10-CM | POA: Diagnosis not present

## 2019-05-07 DIAGNOSIS — N938 Other specified abnormal uterine and vaginal bleeding: Secondary | ICD-10-CM | POA: Diagnosis not present

## 2019-05-11 DIAGNOSIS — N399 Disorder of urinary system, unspecified: Secondary | ICD-10-CM | POA: Diagnosis not present

## 2019-05-11 DIAGNOSIS — E221 Hyperprolactinemia: Secondary | ICD-10-CM | POA: Diagnosis not present

## 2019-07-08 DIAGNOSIS — M545 Low back pain: Secondary | ICD-10-CM | POA: Diagnosis not present

## 2019-11-03 IMAGING — US US OB COMP LESS 14 WK
1 series · 15 of 25 positions shown · non-contrast
Comparison: 06/26/2017

CLINICAL DATA: Vaginal bleeding and first-trimester pregnancy

EXAM:
OBSTETRIC <14 WK ULTRASOUND
TECHNIQUE: Transabdominal ultrasound was performed for evaluation of the
gestation as well as the maternal uterus and adnexal regions.

[Series 1: us ob comp less 14 wk · 25 acquisitions, 15 frames shown]
[im 1/25]
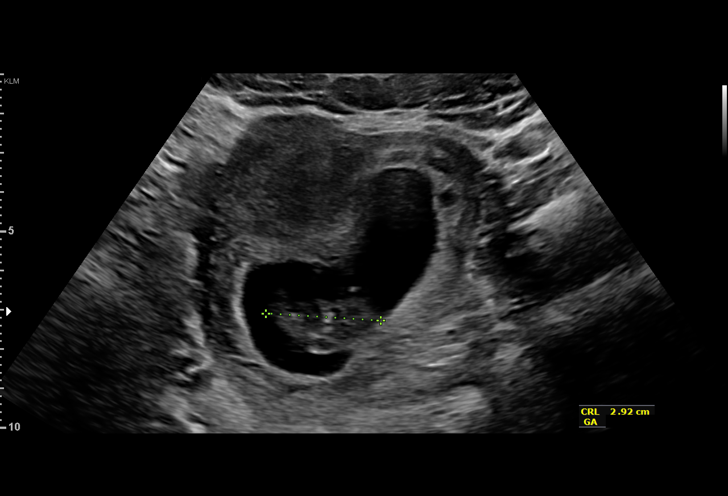
[im 3/25]
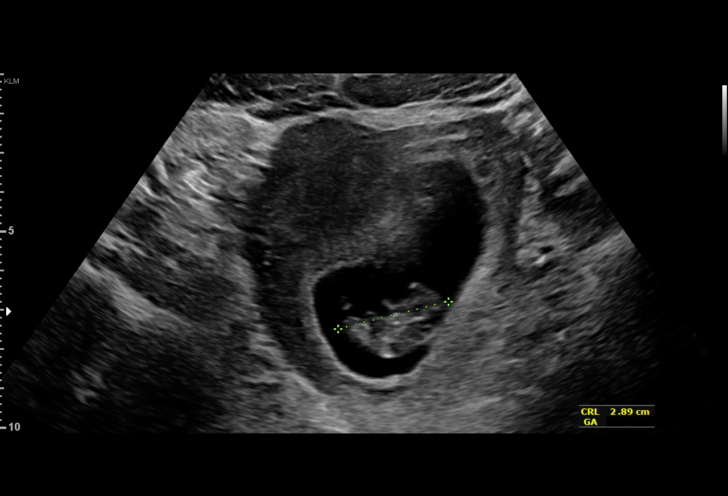
[im 5/25]
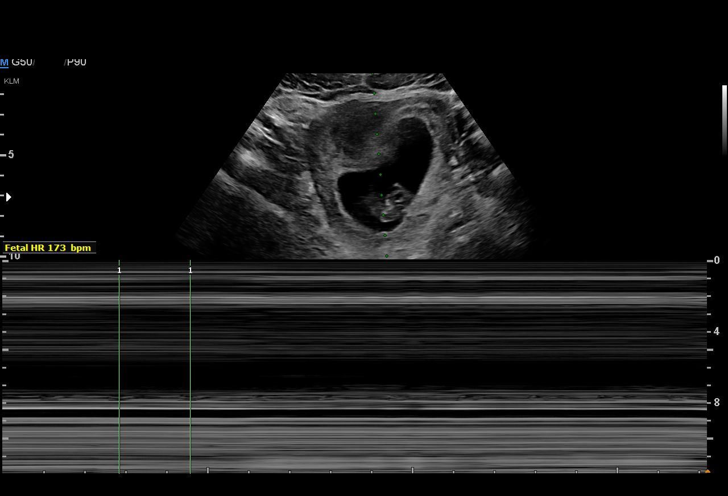
[im 6/25]
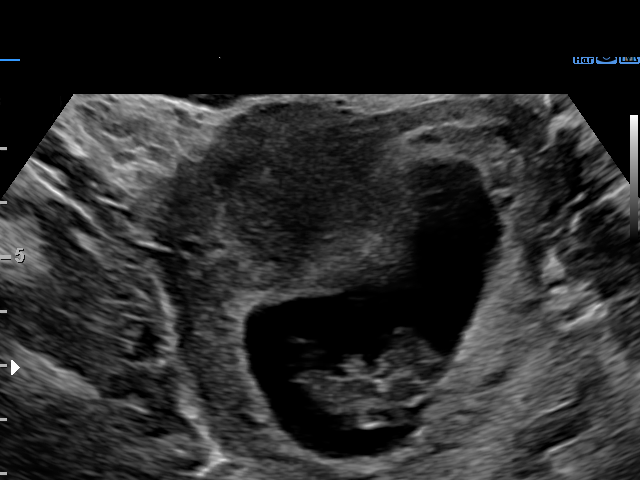
[im 8/25]
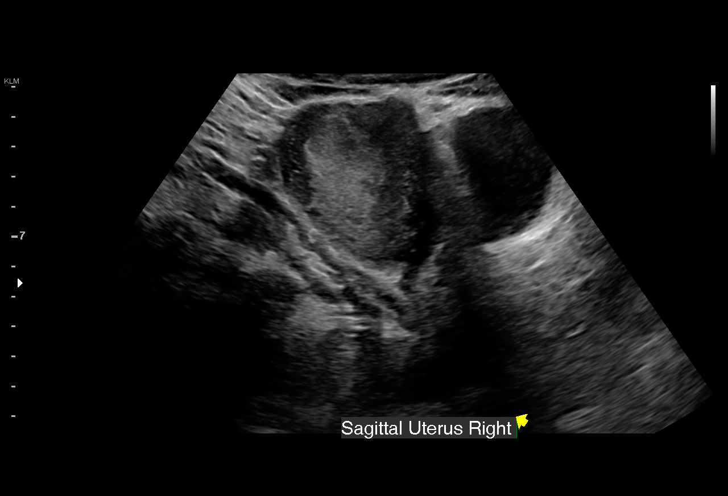
[im 10/25]
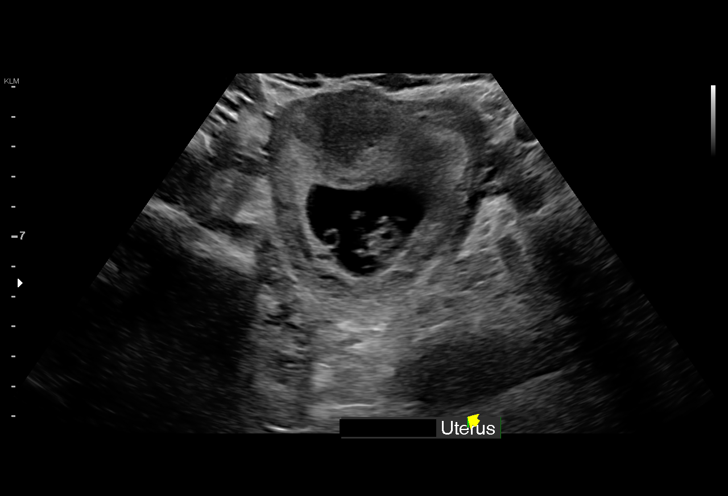
[im 11/25]
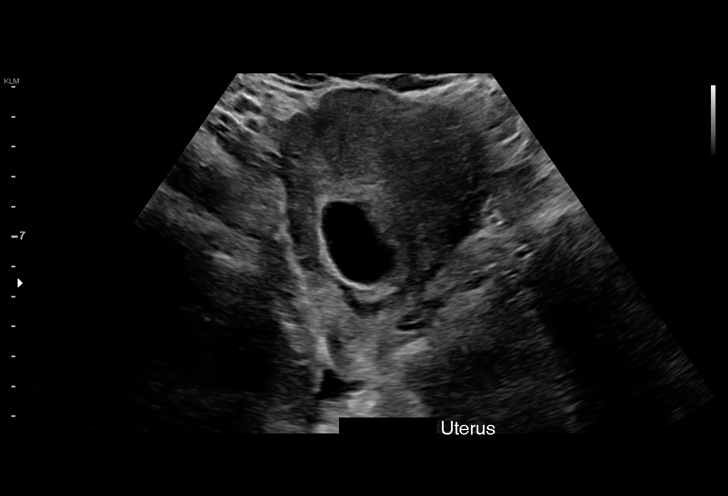
[im 13/25]
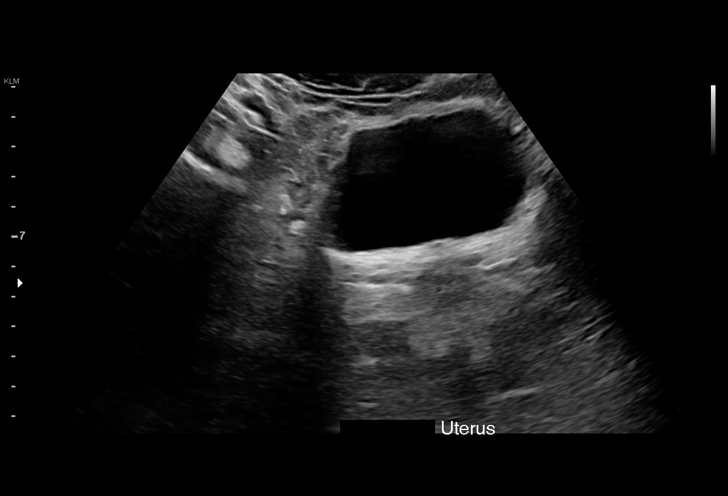
[im 15/25]
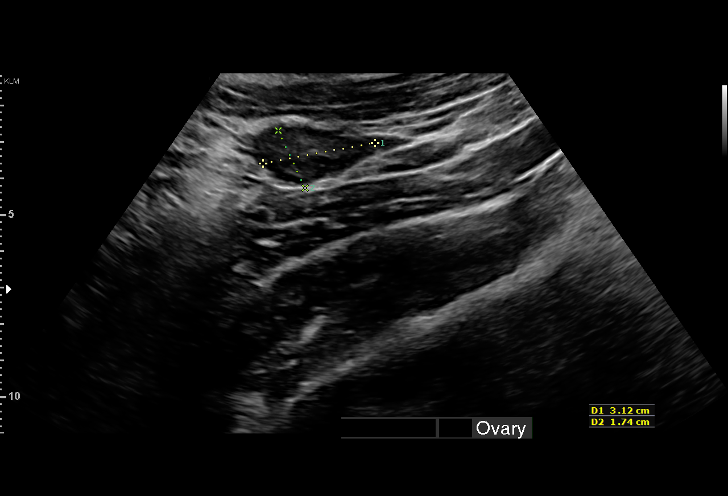
[im 16/25]
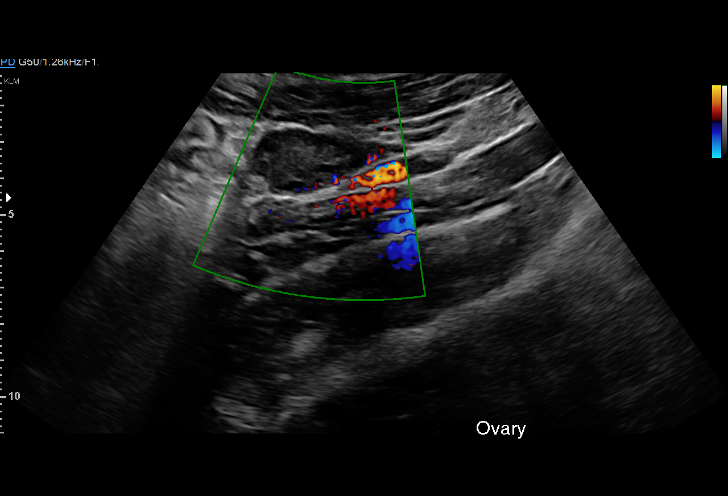
[im 18/25]
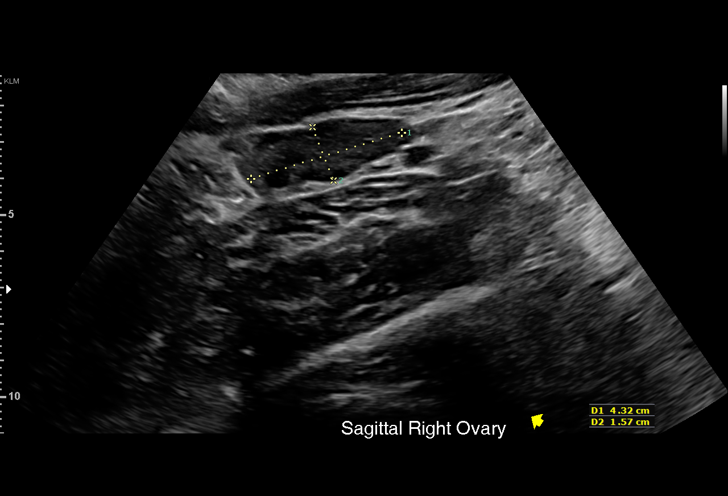
[im 20/25]
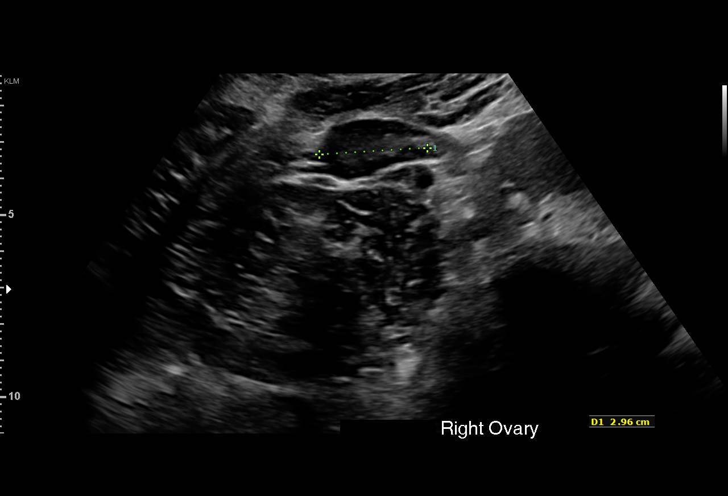
[im 21/25]
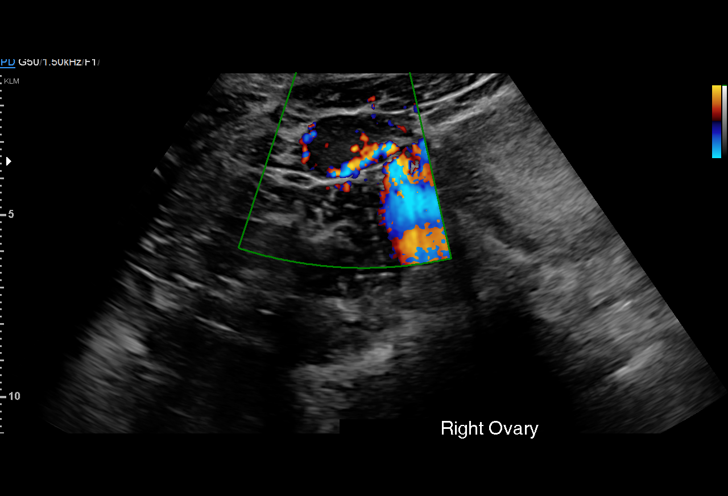
[im 23/25]
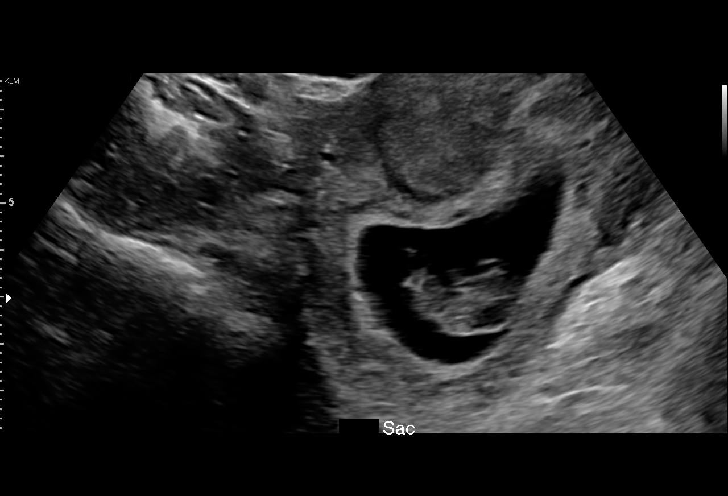
[im 25/25]
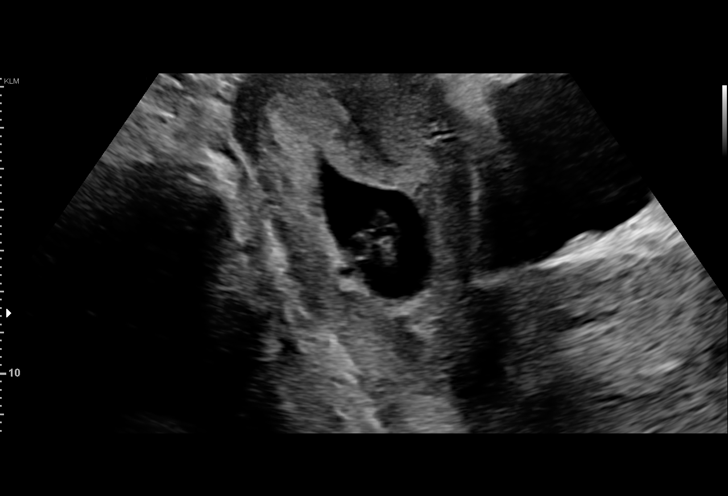

[15 of 25 positions shown; findings below may reference images not displayed]

FINDINGS: Intrauterine gestational sac: Single

Yolk sac:  Visualized.

Embryo:  Visualized.

Cardiac Activity: Visualized.

Heart Rate: 173 bpm

CRL: 29.1 Mm 9 w 5 d US EDC: 02/04/2018 -there has been expected
growth since prior

Subchorionic hemorrhage:  None visualized.

Maternal uterus/adnexae: Focal thick appearance of the myometrial
wall on transverse images is not reproduced on sagittal images and
is likely from imaging angle. No fibroid seen on prior. Normal
adnexae.
IMPRESSION: Single living intrauterine pregnancy measuring 9 weeks 5 days.
Expected growth since study 06/26/2017. No adverse finding.

## 2020-01-11 IMAGING — US US MFM OB DETAIL+14 WK
1 series · 13 of 28 positions shown · non-contrast
Comparison: none

[facility]]

Indications
19 weeks gestation of pregnancy
Encounter for antenatal screening for
malformations
Cervical insufficiency, 2nd
Obesity complicating pregnancy, second
trimester
Cervical shortening, second trimester
OB History
Gravidity:    2         Term:   0        Prem:   0        SAB:   1
TOP:          0       Ectopic:  0        Living: 0
Fetal Evaluation
Num Of Fetuses:     1
Fetal Heart         151
Rate(bpm):
Cardiac Activity:   Observed
Presentation:       Breech
Placenta:           Posterior, above cervical os
P. Cord Insertion:  Visualized
Amniotic Fluid
AFI FV:      Subjectively within normal limits
Largest Pocket(cm)
4.13
Biometry
BPD:      44.7  mm     G. Age:  19w 4d         72  %    CI:        74.88   %    70 - 86
FL/HC:      17.7   %    16.1 -
HC:      163.9  mm     G. Age:  19w 1d         49  %    HC/AC:      1.13        1.09 -
AC:      145.2  mm     G. Age:  19w 6d         73  %    FL/BPD:     64.9   %
FL:         29  mm     G. Age:  18w 6d         40  %    FL/AC:      20.0   %    20 - 24
HUM:      29.7  mm     G. Age:  19w 5d         69  %
CER:      23.2  mm     G. Age:  21w 4d       > 95  %
NFT:       4.4  mm
CM:        4.3  mm
Est. FW:     290  gm    0 lb 10 oz      52  %
Gestational Age
LMP:           19w 0d        Date:  05/04/17                 EDD:   02/08/18
U/S Today:     19w 3d                                        EDD:   02/05/18
Best:          19w 0d     Det. By:  LMP  (05/04/17)          EDD:   02/08/18
Anatomy
Cranium:               Appears normal         LVOT:                   Appears normal
Cavum:                 Appears normal         Aortic Arch:            Appears normal
Ventricles:            Appears normal         Ductal Arch:            Appears normal
Choroid Plexus:        Appears normal         Diaphragm:              Appears normal
Cerebellum:            Appears normal         Stomach:                Appears normal, left
sided
Posterior Fossa:       Appears normal         Abdomen:                Appears normal
Nuchal Fold:           Appears normal         Abdominal Wall:         Appears nml (cord
insert, abd wall)
Face:                  Appears normal         Cord Vessels:           Appears normal (3
(orbits and profile)                           vessel cord)
Lips:                  Appears normal         Kidneys:                Appear normal
Palate:                Appears normal         Bladder:                Appears normal
Thoracic:              Appears normal         Spine:                  Appears normal
Heart:                 Appears normal         Upper Extremities:      Appears normal
(4CH, axis, and
situs)
RVOT:                  Appears normal         Lower Extremities:      Appears normal
Other:  Male gender. Heels and 5th digit visualized. Technically difficult due
to fetal position.
Cervix Uterus Adnexa
Cervix
Funneling of internal os noted.
Uterus
No abnormality visualized.
Left Ovary
Size(cm)       2.9  x   2.1    x  1.5       Vol(ml):
Within normal limits.
Right Ovary
Size(cm)       3.3  x   3.3    x  2.2       Vol(ml):
Cul De Sac:   No free fluid seen.
Impression
INDICATION: 31 yr old [id number] at 42w1d for fetal anatomic
survey.

[Series 1: us mfm ob detail+14 wk · 13 of 140 slices shown]
[im 6/140]
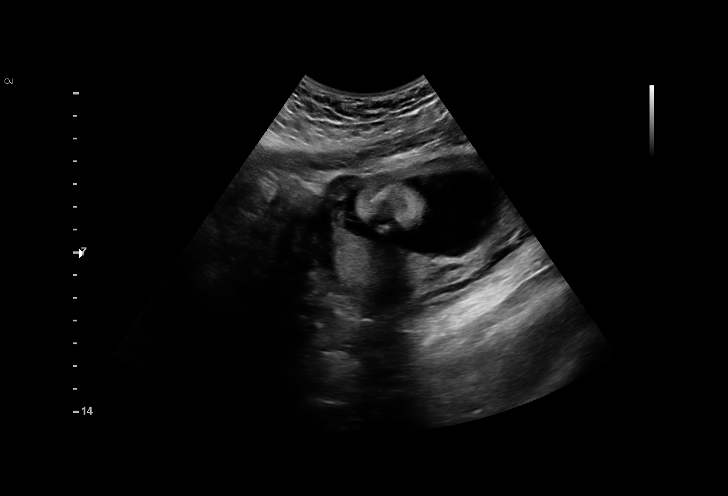
[im 16/140]
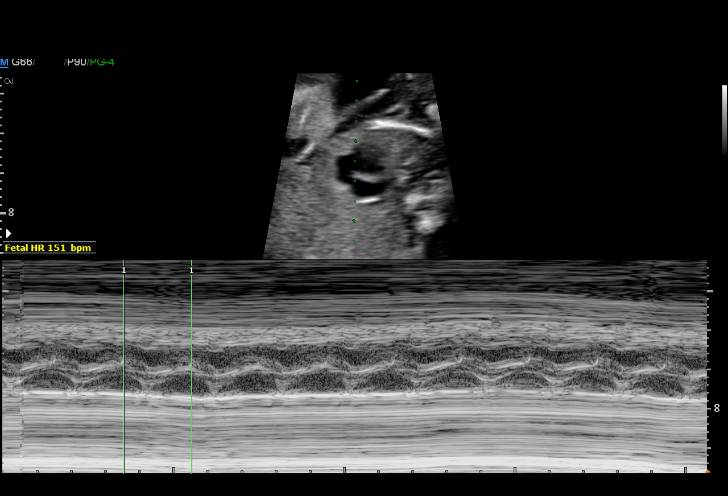
[im 26/140]
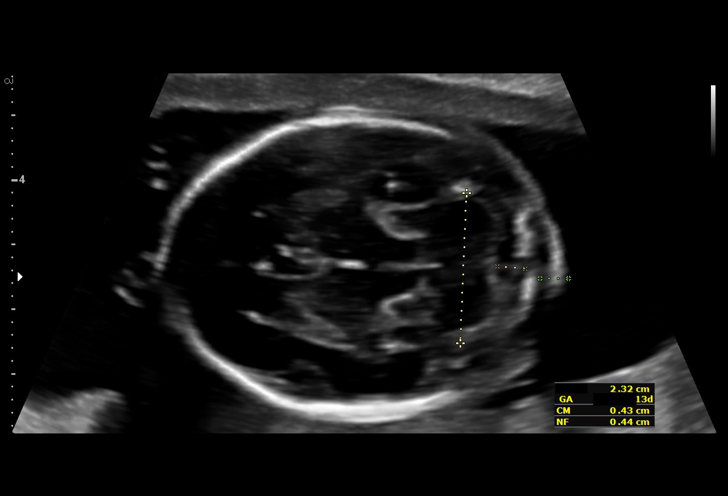
[im 37/140]
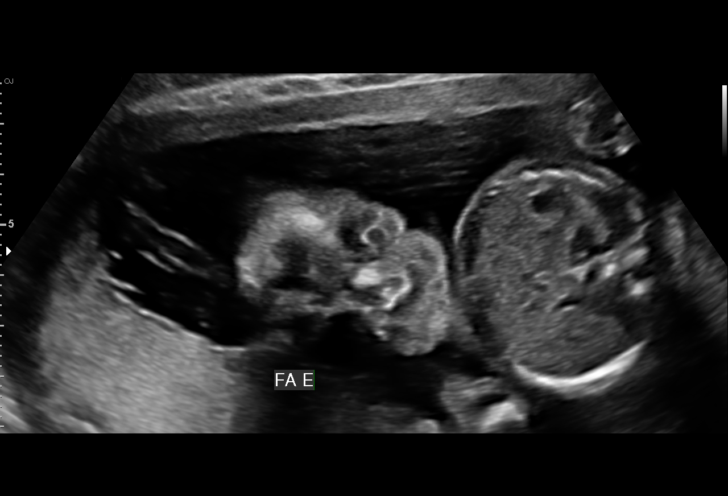
[im 47/140]
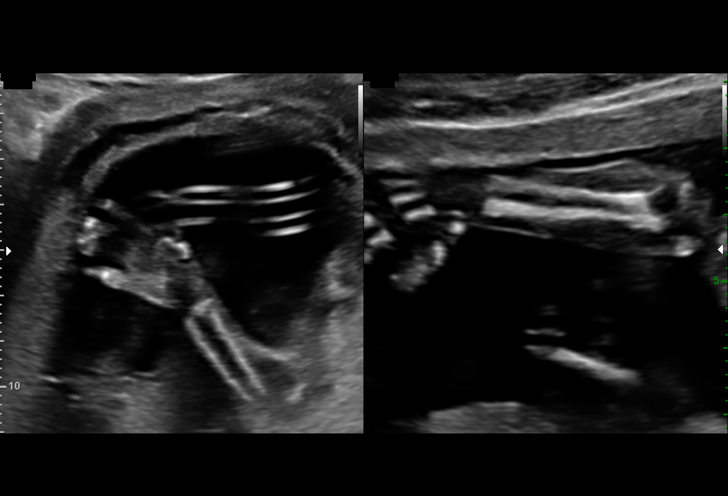
[im 57/140]
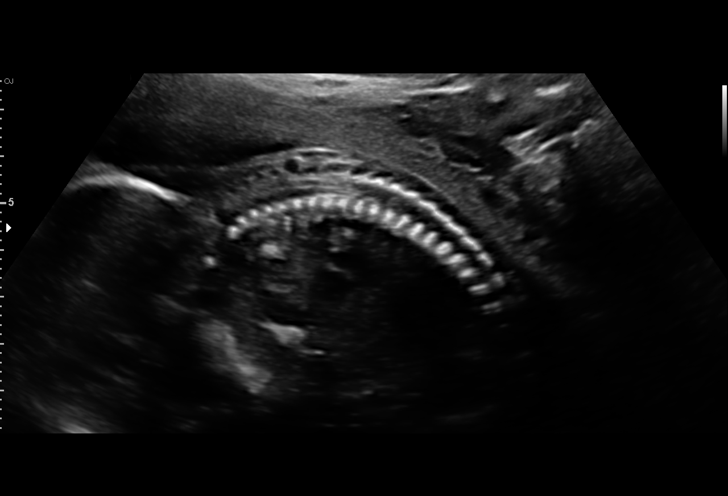
[im 73/140]
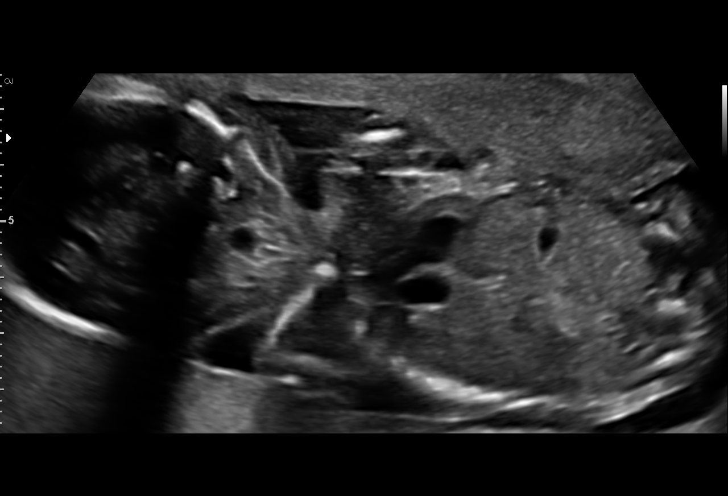
[im 83/140]
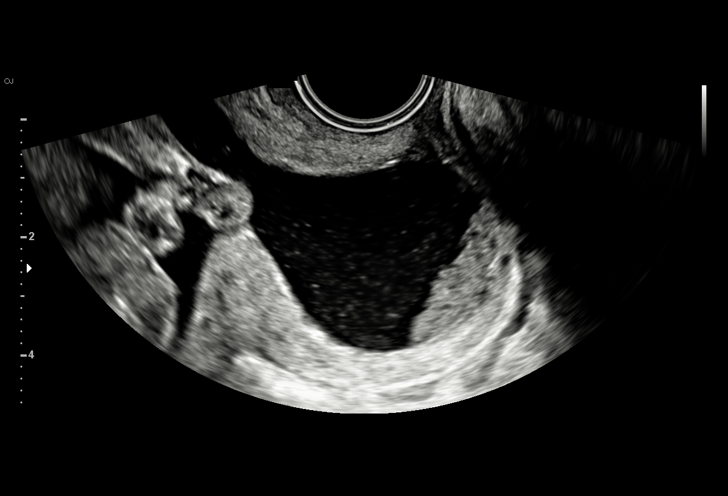
[im 93/140]
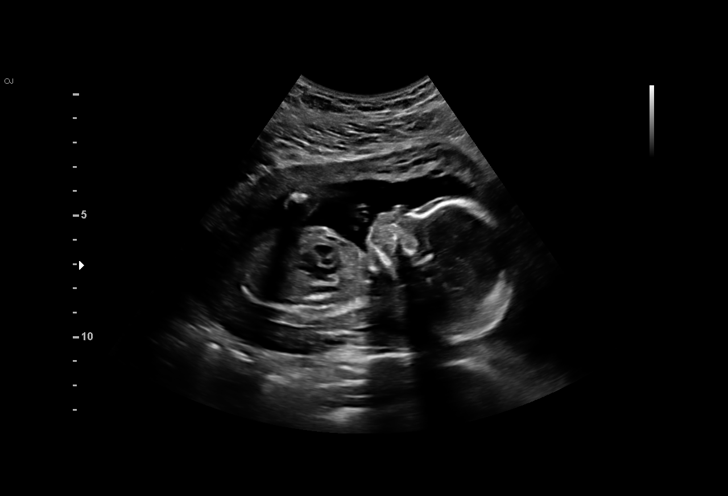
[im 103/140]
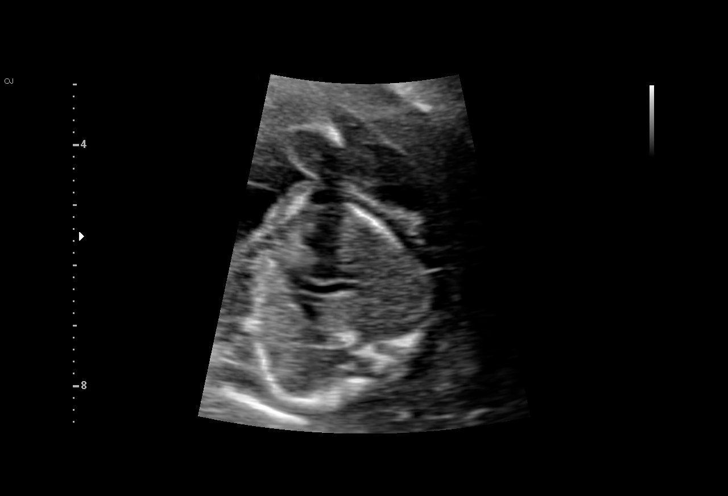
[im 114/140]
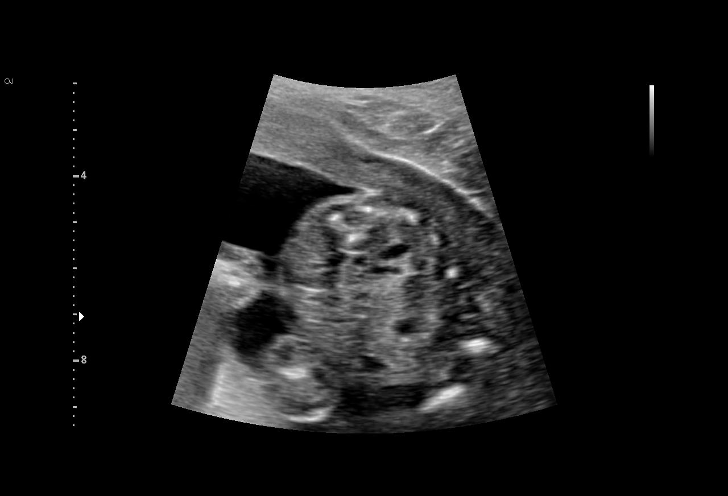
[im 124/140]
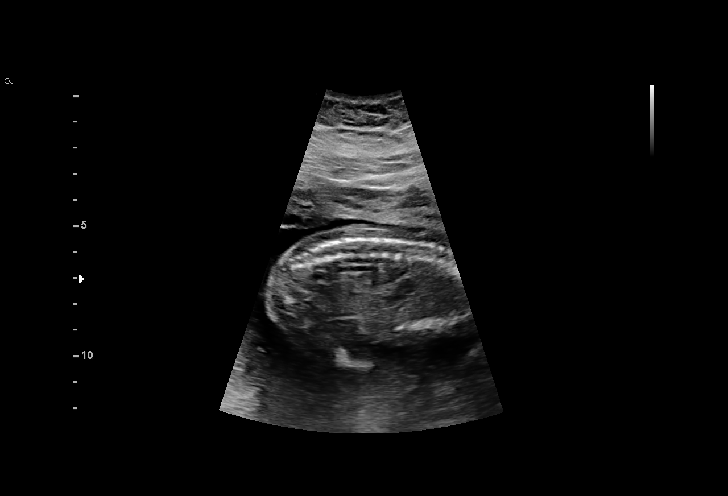
[im 134/140]
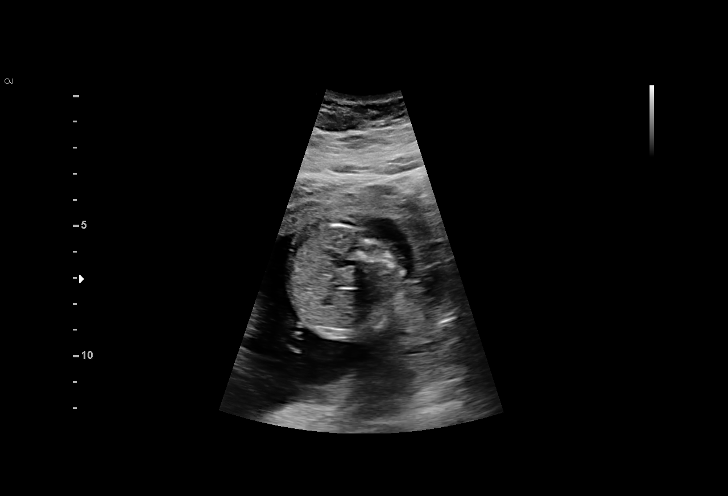

[13 of 28 positions shown; findings below may reference images not displayed]

FINDINGS: 1. Single intrauterine pregnancy.
2. Fetal biometry is consistent with dating.
3. Posterior placenta without evidence of previa.
4. Normal amniotic fluid volume.
5. There is no measurable cervix; cervix appears at least 1-
2cm dilated with funneling membranes. There is debris in the
lower uterine segment.
6. Normal fetal anatomic survey.
Recommendations

1. Appropriate fetal growth.
2. Normal fetal anatomic survey.
3. Low risk cell free fetal DNA.
4. Cervical dilation:
- counseled on increased risk of previable PPROM, delivery,
preterm PPROM/delivery
- discussed options of cerclage, vaginal progesterone, and
pessary and risks/benefits of each
- discussed fetal outcomes at 22, 24, and 28 weeks
- after counseling patient is interested in being evaluated for
exam indicated cerclage with addition of vaginal
progesterone- she was sent to [facility]OSP[facility]] for
evaluation and if deemed a candidate will have a cerclage
placed; then recommend vaginal progesterone nightly until 36
weeks
- recommend pelvic rest
- recommend modified activity
- recommend preterm labor precautions
- recommend cervical length in 1 week if cerclage is placed
- consider Jim course at viability if clinically
appropriate

Discussed with Nurmuhammed. Vaganza and Tjiurisa Nguvauva

## 2020-01-14 IMAGING — US US MFM OB LIMITED
1 series · 14 of 14 positions shown · non-contrast
Comparison: none

[Series 1: us mfm ob limited · 14 acquisitions, 14 frames shown]
[im 1/14]
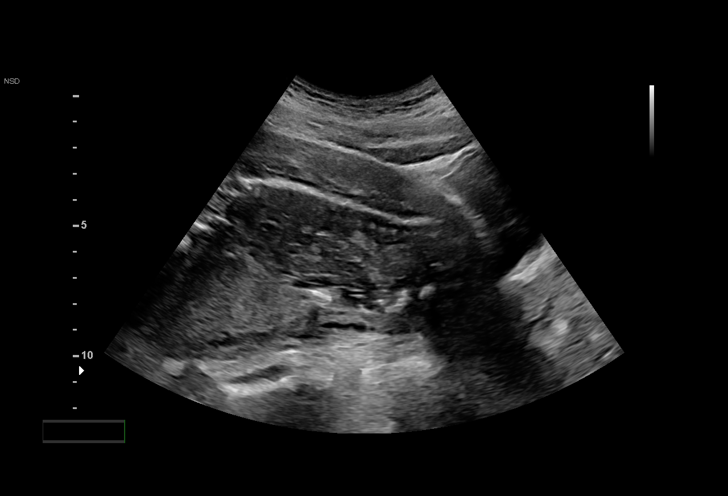
[im 2/14]
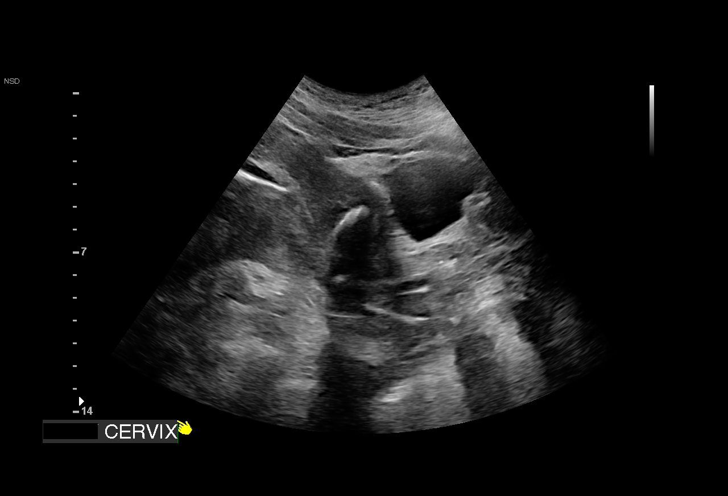
[im 3/14]
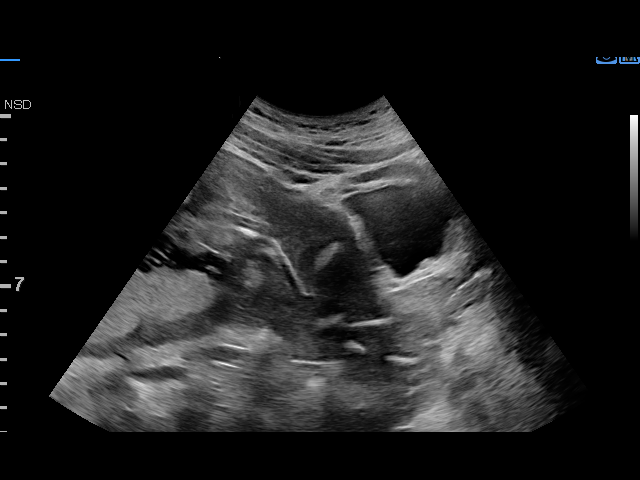
[im 4/14]
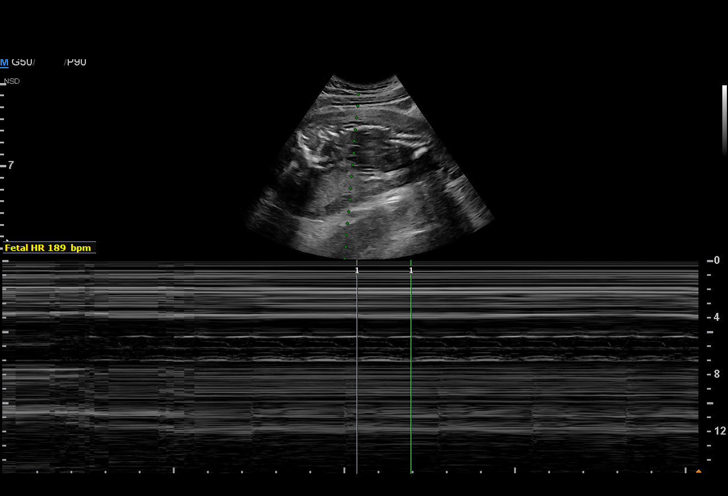
[im 5/14]
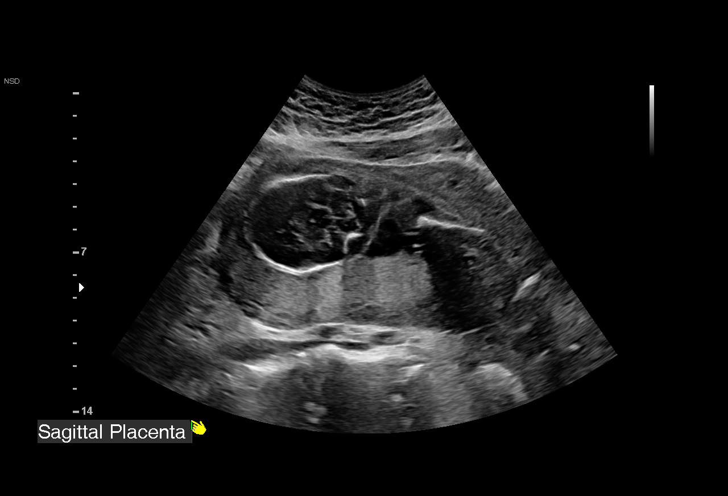
[im 6/14]
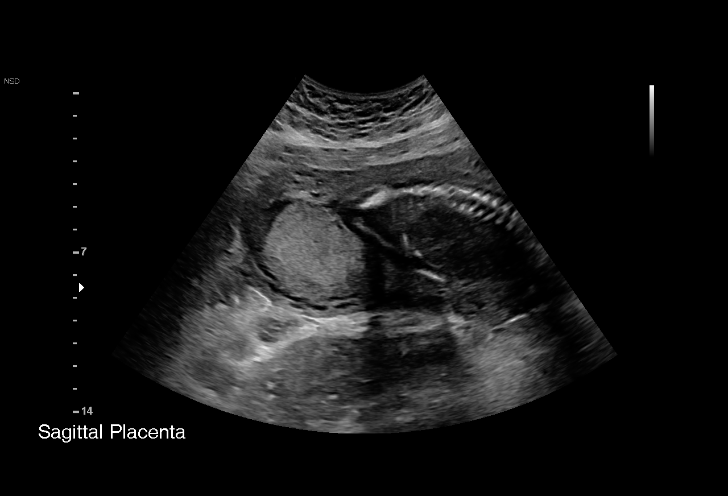
[im 7/14]
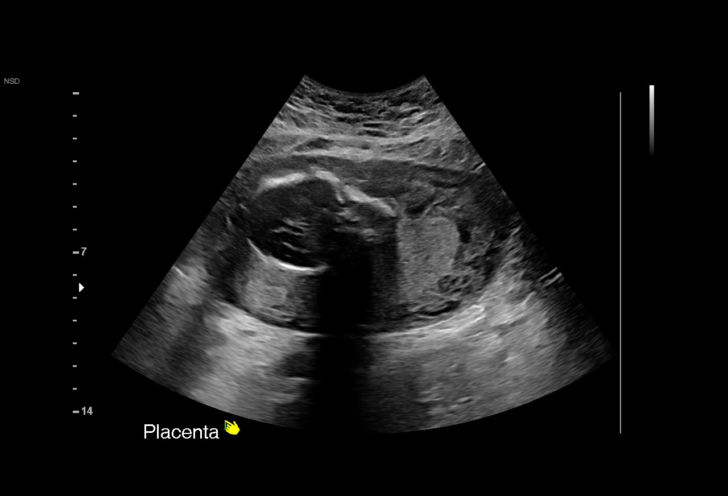
[im 8/14]
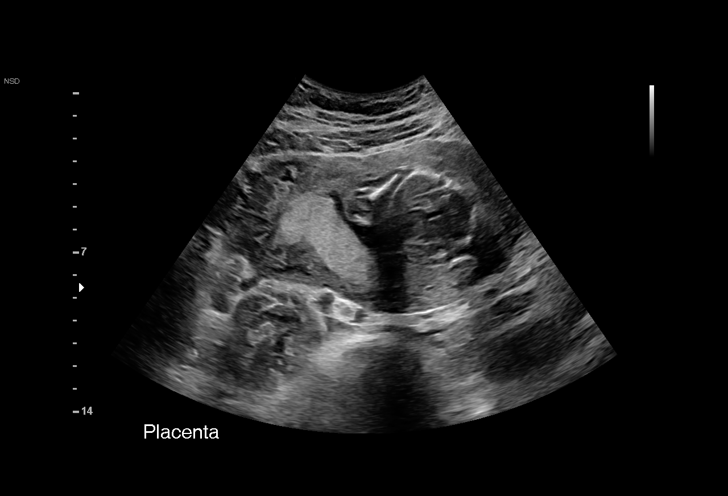
[im 9/14]
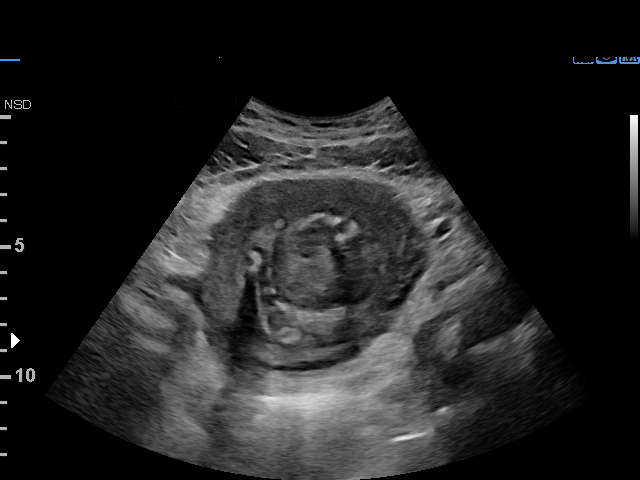
[im 10/14]
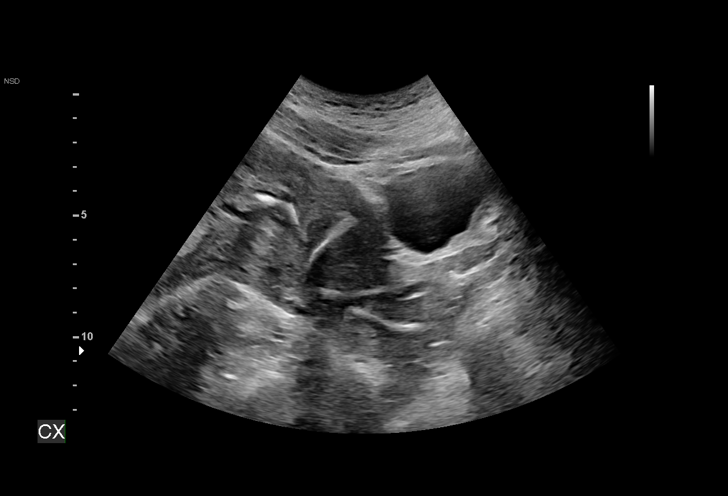
[im 11/14]
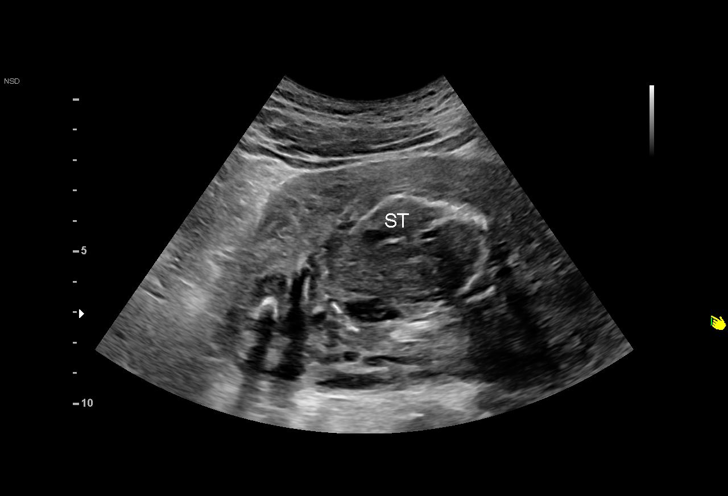
[im 12/14]
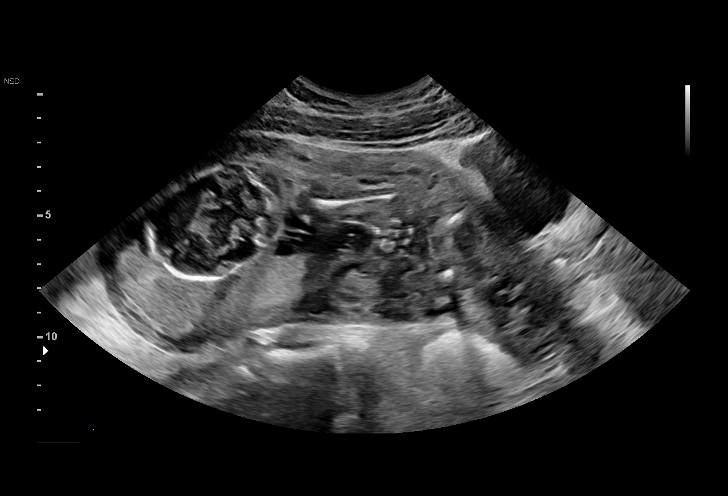
[im 13/14]
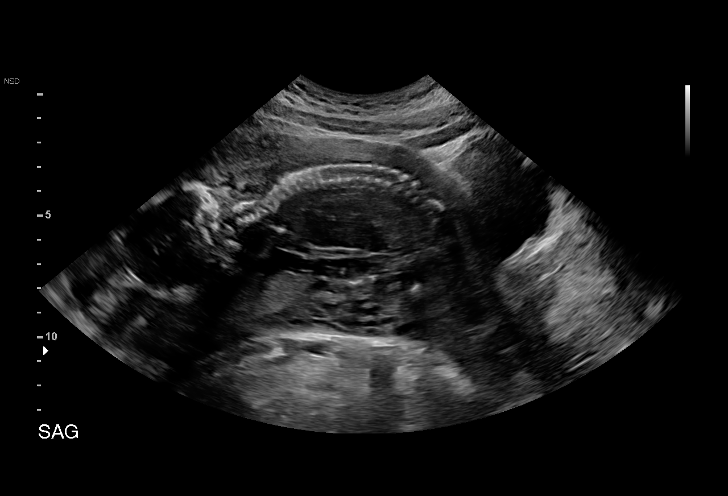
[im 14/14]
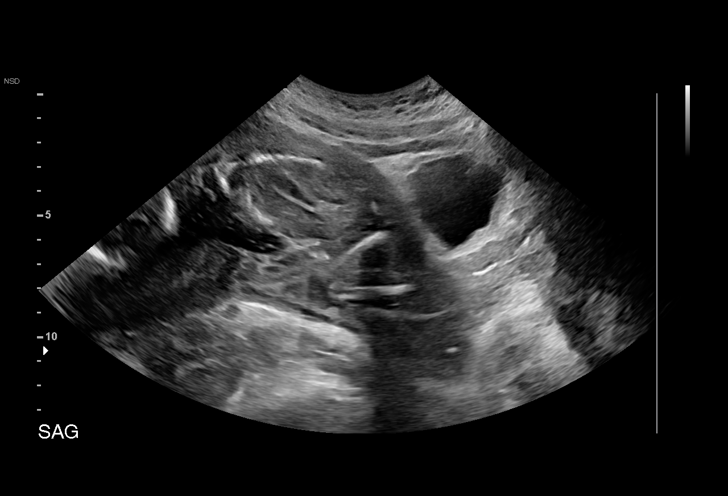

[14 of 14 positions shown; findings below may reference images not displayed]

MAU/Triage

Indications

19 weeks gestation of pregnancy
Premature rupture of membranes - leaking
fluid (PPROM)
Determine Fetal presentation by ultrasound
OB History

Gravidity:    2         Term:   0        Prem:   0        SAB:   1
TOP:          0       Ectopic:  0        Living: 0
Fetal Evaluation

Num Of Fetuses:     1
Fetal Heart         189
Rate(bpm):
Cardiac Activity:   Observed
Presentation:       Breech, footling
Placenta:           Posterior, above cervical os
P. Cord Insertion:  Previously Visualized

Amniotic Fluid
AFI FV:      Severe olioghydramnios
Gestational Age

LMP:           19w 3d        Date:  05/04/17                 EDD:   02/08/18
Best:          19w 3d     Det. By:  LMP  (05/04/17)          EDD:   02/08/18
Cervix Uterus Adnexa
Cervix
Fetal legs/feet protruding through cervical canal into vagina.
Impression

SIUP at 19+3 weeks with cardiac activity
Severe oligohydramnios
Footing breech presentation
Recommendations

Follow-up ultrasounds as clinically indicated.
